# Patient Record
Sex: Female | Born: 1990 | Race: Black or African American | Hispanic: No | Marital: Single | Smoking: Never smoker
Health system: Southern US, Community
[De-identification: ages and names within clinical notes are randomized; demographics above are authoritative.]

## PROBLEM LIST (undated history)

## (undated) ENCOUNTER — Inpatient Hospital Stay (HOSPITAL_COMMUNITY): Payer: Self-pay

## (undated) DIAGNOSIS — D649 Anemia, unspecified: Secondary | ICD-10-CM

## (undated) DIAGNOSIS — Z148 Genetic carrier of other disease: Secondary | ICD-10-CM

## (undated) DIAGNOSIS — O039 Complete or unspecified spontaneous abortion without complication: Secondary | ICD-10-CM

## (undated) HISTORY — PX: NO PAST SURGERIES: SHX2092

## (undated) HISTORY — DX: Genetic carrier of other disease: Z14.8

---

## 2012-10-16 ENCOUNTER — Encounter (HOSPITAL_COMMUNITY): Payer: Self-pay | Admitting: Cardiology

## 2012-10-16 ENCOUNTER — Emergency Department (HOSPITAL_COMMUNITY)
Admission: EM | Admit: 2012-10-16 | Discharge: 2012-10-16 | Disposition: A | Discharge status: Home / Self Care | Payer: Medicaid Other | Attending: Emergency Medicine | Admitting: Emergency Medicine

## 2012-10-16 DIAGNOSIS — L0889 Other specified local infections of the skin and subcutaneous tissue: Secondary | ICD-10-CM | POA: Insufficient documentation

## 2012-10-16 DIAGNOSIS — K140 Glossitis: Secondary | ICD-10-CM

## 2012-10-16 DIAGNOSIS — K122 Cellulitis and abscess of mouth: Secondary | ICD-10-CM

## 2012-10-16 MED ORDER — SULFAMETHOXAZOLE-TMP DS 800-160 MG PO TABS
1.0000 | ORAL_TABLET | Freq: Once | ORAL | Status: AC
  Administered 2012-10-16: 1 via ORAL
  Filled 2012-10-16: qty 1

## 2012-10-16 MED ORDER — SULFAMETHOXAZOLE-TRIMETHOPRIM 400-80 MG PO TABS: 1.0000 | ORAL_TABLET | Freq: Two times a day (BID) | ORAL | Status: DC

## 2012-10-16 NOTE — ED Notes (Signed)
Pt reports about 2 weeks ago she noticed a lump under her tongue. Reports some swelling. Pt states she had her tongue pierced about a year ago, but no problems since.

## 2012-10-16 NOTE — ED Provider Notes (Signed)
History  This chart was scribed for non-physician practitioner Marlon Pel, PA-C working with Juliet Rude. Rubin Payor, MD, by Candelaria Stagers, ED Scribe. This patient was seen in room TR08C/TR08C and the patient's care was started at 6:38 PM   CSN: [id number]  Arrival date & time 10/16/12  1654   First MD Initiated Contact with Patient 10/16/12 1724      Chief Complaint  Patient presents with  . Pain     The history is provided by the patient. No language interpreter was used.   Mary Curry is a 22 y.o. female who presents to the Emergency Department complaining of a lump under her tongue that she noticed two weeks ago with gradually increasing pain.  Pt has a tongue ring that was placed two years ago.  She reports the area is swollen.  She denies SOB, difficulty swallowing, fever, chills, nausea, or vomiting.  Nothing seems to make the sx better or worse.    History reviewed. No pertinent past medical history.  History reviewed. No pertinent past surgical history.  History reviewed. No pertinent family history.  History  Substance Use Topics  . Smoking status: Never Smoker   . Smokeless tobacco: Not on file  . Alcohol Use: No    OB History   Grav Para Term Preterm Abortions TAB SAB Ect Mult Living                  Review of Systems  Constitutional: Negative for fever and chills.  HENT: Negative for sore throat, trouble swallowing and voice change.        Tenderness and pain under the tongue.   Respiratory: Negative for shortness of breath.   Gastrointestinal: Negative for nausea and vomiting.  All other systems reviewed and are negative.    Allergies  Review of patient's allergies indicates no known allergies.  Home Medications   Current Outpatient Rx  Name  Route  Sig  Dispense  Refill  . ibuprofen (ADVIL,MOTRIN) 200 MG tablet   Oral   Take 400 mg by mouth daily as needed for pain.         Marland Kitchen sulfamethoxazole-trimethoprim (SEPTRA) 400-80 MG per tablet    Oral   Take 1 tablet by mouth 2 (two) times daily.   14 tablet   0     BP 118/77  Temp(Src) 98.3 F (36.8 C) (Oral)  Resp 18  SpO2 97%  Physical Exam  Nursing note and vitals reviewed. Constitutional: She is oriented to person, place, and time. She appears well-developed and well-nourished. No distress.  HENT:  Head: Normocephalic and atraumatic.  Pain to the left ventral surface of the tongue with no obvious area of redness or abscess.  Pt has tongue ring in place with food wrapped around.    Eyes: EOM are normal.  Neck: Normal range of motion. Neck supple. No tracheal deviation present.  Cardiovascular: Normal rate.   Pulmonary/Chest: Effort normal. No stridor. No respiratory distress.  Musculoskeletal: Normal range of motion.  Neurological: She is alert and oriented to person, place, and time.  Skin: Skin is warm and dry.  Psychiatric: She has a normal mood and affect. Her behavior is normal.    ED Course  Procedures   DIAGNOSTIC STUDIES: Oxygen Saturation is 97% on room air, normal by my interpretation.    COORDINATION OF CARE:  6:52 PM Discussed course of care with pt which includes antibiotics.  Advised pt to remove the tongue ring. Tongue ring removed in ED. Pt  understands and agrees.   Labs Reviewed - No data to display No results found.   1. Pierced tongue infection       MDM  Pt has been advised of the symptoms that warrant their return to the ED. Patient has voiced understanding and has agreed to follow-up with the PCP or specialist.  I personally performed the services described in this documentation, which was scribed in my presence. The recorded information has been reviewed and is accurate.       Dorthula Matas, PA-C 10/16/12 2326

## 2012-10-17 NOTE — ED Provider Notes (Signed)
Medical screening examination/treatment/procedure(s) were performed by non-physician practitioner and as supervising physician I was immediately available for consultation/collaboration.  Mj Willis R. Irl Bodie, MD 10/17/12 0002 

## 2013-07-29 ENCOUNTER — Encounter (HOSPITAL_COMMUNITY): Payer: Self-pay

## 2013-07-29 ENCOUNTER — Inpatient Hospital Stay (HOSPITAL_COMMUNITY)
Admission: AD | Admit: 2013-07-29 | Discharge: 2013-07-29 | Disposition: A | Discharge status: Home / Self Care | Payer: Medicaid Other | Source: Ambulatory Visit | Attending: Obstetrics & Gynecology | Admitting: Obstetrics & Gynecology

## 2013-07-29 DIAGNOSIS — Z3202 Encounter for pregnancy test, result negative: Secondary | ICD-10-CM | POA: Insufficient documentation

## 2013-07-29 LAB — URINALYSIS, ROUTINE W REFLEX MICROSCOPIC
Bilirubin Urine: NEGATIVE
Ketones, ur: NEGATIVE mg/dL
Protein, ur: 30 mg/dL — AB
Urobilinogen, UA: 1 mg/dL (ref 0.0–1.0)

## 2013-07-29 LAB — CBC
MCH: 28.1 pg (ref 26.0–34.0)
MCHC: 36.7 g/dL — ABNORMAL HIGH (ref 30.0–36.0)
Platelets: 249 10*3/uL (ref 150–400)
RBC: 4.67 MIL/uL (ref 3.87–5.11)
RDW: 13.9 % (ref 11.5–15.5)

## 2013-07-29 LAB — URINE MICROSCOPIC-ADD ON

## 2013-07-29 LAB — ABO/RH: ABO/RH(D): AB POS

## 2013-07-29 NOTE — MAU Note (Addendum)
Here for pregnancy test.  Has done 2 home test, one was neg, one was positive.  Was throwing up this morning.  Has been having bleeding, like a period off and on for past wk.

## 2013-07-29 NOTE — MAU Provider Note (Signed)
History     CSN: [id number]  Arrival date and time: 07/29/13 1129   First Provider Initiated Contact with Patient 07/29/13 1259      Chief Complaint  Patient presents with  . Possible Pregnancy  . Nausea   Possible Pregnancy    Casia Corti is a 22 y.o. who presents today for a pregnancy test. She states that she took two at home. One was negative, and one was positive. She has been bleeding like a period for one week.   Past Medical History  Diagnosis Date  . Medical history non-contributory     Past Surgical History  Procedure Laterality Date  . No past surgeries      No family history on file.  History  Substance Use Topics  . Smoking status: Never Smoker   . Smokeless tobacco: Not on file  . Alcohol Use: No    Allergies: No Known Allergies  Prescriptions prior to admission  Medication Sig Dispense Refill  . ibuprofen (ADVIL,MOTRIN) 200 MG tablet Take 400 mg by mouth daily as needed for pain.      Marland Kitchen sulfamethoxazole-trimethoprim (SEPTRA) 400-80 MG per tablet Take 1 tablet by mouth 2 (two) times daily.  14 tablet  0    ROS Physical Exam   Blood pressure 116/63, pulse 73, temperature 98.9 F (37.2 C), temperature source Oral, resp. rate 18, height 5' 4.5" (1.638 m), weight 68.04 kg (150 lb).  Physical Exam  Nursing note and vitals reviewed. Constitutional: She is oriented to person, place, and time. She appears well-developed and well-nourished. No distress.  Cardiovascular: Normal rate.   Respiratory: Effort normal.  GI: Soft. There is no tenderness.  Neurological: She is alert and oriented to person, place, and time.  Skin: Skin is warm and dry.  Psychiatric: She has a normal mood and affect.    MAU Course  Procedures  Results for orders placed during the hospital encounter of 07/29/13 (from the past 24 hour(s))  PREGNANCY, URINE     Status: None   Collection Time    07/29/13 12:05 PM      Result Value Range   Preg Test, Ur NEGATIVE   NEGATIVE  ABO/RH     Status: None   Collection Time    07/29/13 12:05 PM      Result Value Range   ABO/RH(D) AB POS    HCG, QUANTITATIVE, PREGNANCY     Status: None   Collection Time    07/29/13 12:05 PM      Result Value Range   hCG, Beta Chain, Quant, S <1  <5 mIU/mL  URINALYSIS, ROUTINE W REFLEX MICROSCOPIC     Status: Abnormal   Collection Time    07/29/13 12:05 PM      Result Value Range   Color, Urine YELLOW  YELLOW   APPearance CLOUDY (*) CLEAR   Specific Gravity, Urine 1.015  1.005 - 1.030   pH 8.0  5.0 - 8.0   Glucose, UA NEGATIVE  NEGATIVE mg/dL   Hgb urine dipstick LARGE (*) NEGATIVE   Bilirubin Urine NEGATIVE  NEGATIVE   Ketones, ur NEGATIVE  NEGATIVE mg/dL   Protein, ur 30 (*) NEGATIVE mg/dL   Urobilinogen, UA 1.0  0.0 - 1.0 mg/dL   Nitrite NEGATIVE  NEGATIVE   Leukocytes, UA NEGATIVE  NEGATIVE  URINE MICROSCOPIC-ADD ON     Status: Abnormal   Collection Time    07/29/13 12:05 PM      Result Value Range   Squamous Epithelial /  LPF FEW (*) RARE   RBC / HPF 11-20  <3 RBC/hpf   Bacteria, UA FEW (*) RARE   Urine-Other AMORPHOUS URATES/PHOSPHATES    CBC     Status: Abnormal   Collection Time    07/29/13 12:06 PM      Result Value Range   WBC 6.8  4.0 - 10.5 K/uL   RBC 4.67  3.87 - 5.11 MIL/uL   Hemoglobin 13.1  12.0 - 15.0 g/dL   HCT 16.1 (*) 09.6 - 04.5 %   MCV 76.4 (*) 78.0 - 100.0 fL   MCH 28.1  26.0 - 34.0 pg   MCHC 36.7 (*) 30.0 - 36.0 g/dL   RDW 40.9  81.1 - 91.4 %   Platelets 249  150 - 400 K/uL  POCT PREGNANCY, URINE     Status: None   Collection Time    07/29/13 12:20 PM      Result Value Range   Preg Test, Ur NEGATIVE  NEGATIVE     Assessment and Plan   1. Pregnancy test negative    Follow-up Information   Follow up with [facility] [place]. (As needed)    Contact information:   [address] [place] [address] [phone]     Contraception options discussed   Tawnya Crook 07/29/2013, 1:01 PM

## 2013-10-07 ENCOUNTER — Encounter: Payer: Self-pay | Admitting: Certified Nurse Midwife

## 2014-03-07 ENCOUNTER — Emergency Department (HOSPITAL_COMMUNITY): Payer: Medicaid Other

## 2014-03-07 ENCOUNTER — Emergency Department (HOSPITAL_COMMUNITY)
Admission: EM | Admit: 2014-03-07 | Discharge: 2014-03-07 | Disposition: A | Discharge status: Home / Self Care | Payer: Medicaid Other | Attending: Emergency Medicine | Admitting: Emergency Medicine

## 2014-03-07 ENCOUNTER — Encounter (HOSPITAL_COMMUNITY): Payer: Self-pay | Admitting: Emergency Medicine

## 2014-03-07 DIAGNOSIS — Z3202 Encounter for pregnancy test, result negative: Secondary | ICD-10-CM | POA: Insufficient documentation

## 2014-03-07 DIAGNOSIS — Z792 Long term (current) use of antibiotics: Secondary | ICD-10-CM | POA: Insufficient documentation

## 2014-03-07 DIAGNOSIS — R011 Cardiac murmur, unspecified: Secondary | ICD-10-CM | POA: Insufficient documentation

## 2014-03-07 DIAGNOSIS — R079 Chest pain, unspecified: Secondary | ICD-10-CM

## 2014-03-07 DIAGNOSIS — R0602 Shortness of breath: Secondary | ICD-10-CM | POA: Insufficient documentation

## 2014-03-07 LAB — URINALYSIS, ROUTINE W REFLEX MICROSCOPIC
Bilirubin Urine: NEGATIVE
GLUCOSE, UA: NEGATIVE mg/dL
HGB URINE DIPSTICK: NEGATIVE
Ketones, ur: NEGATIVE mg/dL
Nitrite: NEGATIVE
PROTEIN: NEGATIVE mg/dL
Specific Gravity, Urine: 1.016 (ref 1.005–1.030)
Urobilinogen, UA: 1 mg/dL (ref 0.0–1.0)
pH: 8.5 — ABNORMAL HIGH (ref 5.0–8.0)

## 2014-03-07 LAB — CBC
HEMATOCRIT: 34.8 % — AB (ref 36.0–46.0)
HEMOGLOBIN: 12.6 g/dL (ref 12.0–15.0)
MCH: 28 pg (ref 26.0–34.0)
MCHC: 36.2 g/dL — AB (ref 30.0–36.0)
MCV: 77.3 fL — AB (ref 78.0–100.0)
Platelets: 257 10*3/uL (ref 150–400)
RBC: 4.5 MIL/uL (ref 3.87–5.11)
RDW: 13.4 % (ref 11.5–15.5)
WBC: 7.7 10*3/uL (ref 4.0–10.5)

## 2014-03-07 LAB — I-STAT CHEM 8, ED
BUN: 8 mg/dL (ref 6–23)
CALCIUM ION: 1.14 mmol/L (ref 1.12–1.23)
CREATININE: 0.7 mg/dL (ref 0.50–1.10)
Chloride: 108 mEq/L (ref 96–112)
GLUCOSE: 103 mg/dL — AB (ref 70–99)
HCT: 40 % (ref 36.0–46.0)
Hemoglobin: 13.6 g/dL (ref 12.0–15.0)
Potassium: 3.2 mEq/L — ABNORMAL LOW (ref 3.7–5.3)
SODIUM: 142 meq/L (ref 137–147)
TCO2: 19 mmol/L (ref 0–100)

## 2014-03-07 LAB — URINE MICROSCOPIC-ADD ON

## 2014-03-07 LAB — I-STAT TROPONIN, ED: Troponin i, poc: 0 ng/mL (ref 0.00–0.08)

## 2014-03-07 LAB — POC URINE PREG, ED: Preg Test, Ur: NEGATIVE

## 2014-03-07 NOTE — ED Notes (Signed)
PT comfortable with discharge and follow up instructions. No prescriptions. Pt declines wheelchair, escorted to waiting room.

## 2014-03-07 NOTE — Discharge Instructions (Signed)
Call for a follow up appointment with a Family or Primary Care Provider.  Call a cardiologist for further evaluation of your murmur and chest pain. Return if Symptoms worsen.   Take medication as prescribed.    Emergency Department Resource Guide 1) Find a Doctor and Pay Out of Pocket Although you won't have to find out who is covered by your insurance plan, it is a good idea to ask around and get recommendations. You will then need to call the office and see if the doctor you have chosen will accept you as a new patient and what types of options they offer for patients who are self-pay. Some doctors offer discounts or will set up payment plans for their patients who do not have insurance, but you will need to ask so you aren't surprised when you get to your appointment.  2) Contact Your Local Health Department Not all health departments have doctors that can see patients for sick visits, but many do, so it is worth a call to see if yours does. If you don't know where your local health department is, you can check in your phone book. The CDC also has a tool to help you locate your state's health department, and many state websites also have listings of all of their local health departments.  3) Find a Walk-in Clinic If your illness is not likely to be very severe or complicated, you may want to try a walk in clinic. These are popping up all over the country in pharmacies, drugstores, and shopping centers. They're usually staffed by nurse practitioners or physician assistants that have been trained to treat common illnesses and complaints. They're usually fairly quick and inexpensive. However, if you have serious medical issues or chronic medical problems, these are probably not your best option.  No Primary Care Doctor: - Call Health Connect at  [phone] - they can help you locate a primary care doctor that  accepts your insurance, provides certain services, etc. - Physician Referral Service-  [phone]  Chronic Pain Problems: Organization         Address  Phone   Notes  [facility]  [phone] Patients need to be referred by their primary care doctor.   Medication Assistance: Organization         Address  Phone   Notes  [facility] [address] [place], [place] [address] [phone] --Must be a resident of [place] [place] [place] -- Must have NO insurance coverage whatsoever (no Medicaid/ Medicare, etc.) -- The pt. MUST have a primary care doctor that directs their care regularly and follows them in the community   MedAssist  [phone]   [employer]  [phone]    Agencies that provide inexpensive medical care: Organization         Address  Phone   Notes  [facility]  [phone]   [facility]    [phone]   [facility] [address] [place], [place] [address] [phone]   [facility] [address], [place] [phone]   [facility]anned Parenthood    [phone]   [facility]    [phone]   [facility]  [address], [place] Phone:  [phone], Fax:  [phone] Hours of Operation:  9 am - 6 pm, M-F.  Also accepts Medicaid/Medicare and self-pay.  [facility]  [place] for [place], [address], [place] Phone: [phone], Fax: [phone]. Hours of Operation:  8:30 am - 5:30 pm, M-F.  Also accepts Medicaid and self-pay.  [facility] [address] [place] Phone: [phone]   [place], [place], [place] [phone], Ext. [phone] Mondays & Thursdays: 7-9 AM.  First 15 patients are seen on a first come, first serve basis.    [place] Providers:  Organization         Address  Phone   Notes  [facility] [address],  [place] [phone] Also accepts self-pay patients.  [facility] [id number] [place], [place]  [phone]   [place], [address], [place] [phone]   [facility] [address] [place] [phone]   Lucianne Lei [address], [place]   [phone] Only accepts [place] Access [place] patients after they have their name applied to their card.   Self-Pay (no insurance) in [facility]:  Organization         Address  Phone   Notes  Sickle Cell Patients, [facility] [place] [phone]   [facility] [place] [phone]   [facility] [place]  [place], [place], [place] [phone]   [facility]/Dr. Osei-Bonsu  [address], [place] or [address], [place] [phone] Phone number for both [place] and [place] locations is the same.  [facility] [address] [place] [phone]   [facility]  [address], [place] or [address] [place] [address] [phone]   [facility] [address], [place] [phone], phone; [phone], fax Sees patients 1st and 3rd Saturday of every month.  Must not qualify for public or private insurance (i.e. Medicaid, Medicare, [place] Health Choice, Veterans' Benefits)  Household income should be no more than 200% of the poverty level The clinic cannot treat you if you are pregnant or think you are pregnant  Sexually transmitted diseases are not treated at the clinic.    Dental Care: Organization         Address  Phone  Notes  [facility] Department of [facility] [place] [phone] Accepts children up to age 40 who are enrolled in  [place] or [place]; pregnant women with a Medicaid card; and children who have applied for Medicaid or [place] Health Choice, but were declined, whose parents can pay a reduced fee at time of service.  [facility] of [facility]  [address], [place] [phone] Accepts children up to age 85 who are enrolled in [place] or [place]; pregnant women with a Medicaid card; and children who have applied for Medicaid or [place] Health Choice, but were declined, whose parents can pay a reduced fee at time of service.  [place] [facility] Access PROGRAM  [place] [phone] Patients are seen by appointment only. Walk-ins are not accepted. [place] will see patients 62 years of age and older. Monday - Tuesday (8am-5pm) Most Wednesdays (8:30-5pm) $30 per visit,  cash only  [employer] Adult [employer] PROGRAM  [address], [place] [phone] Patients are seen by appointment only. Walk-ins are not accepted. [place] will see patients 68 years of age and older. One Wednesday Evening (Monthly: Volunteer Based).  $30 per visit, cash only  [place]  [phone] for adults; Children under age 32, call Graduate Pediatric Dentistry at [phone]. Children aged 36-14, please call [phone] to request a pediatric application.  Dental services are provided in all areas of dental care including fillings, crowns and bridges, complete and partial dentures, implants, gum treatment, root canals, and extractions. Preventive care is also provided. Treatment is provided to both adults and children. Patients are selected via a lottery and there is often a waiting list.   [facility] [address] [place]  [phone] [email or web address]   [facility] [address] [place], [place] [phone], Ext. [phone] Second and Fourth Thursday of each month, opens at 6:30  AM; Clinic ends at 9 AM.  Patients are seen on a first-come first-served basis, and a limited number are seen during each clinic.   [facility]  [address] Hillard Danker [place], [place] [phone]   Eligibility Requirements You must have lived in [place], [place], or [place] counties for at least the last three months.   You cannot be eligible for state or federal sponsored [employer], including [employer], [place], or [employer].   You generally cannot be eligible for healthcare insurance through your employer.    How to apply: Eligibility screenings are held every Tuesday and Wednesday afternoon from 1:00 pm until 4:00 pm. You do not need an appointment for the interview!  [facility] [address] [place], [place]   [place]  [place] Department  [place]  [phone]    Behavioral Health Resources in the Community: Intensive Outpatient Programs Organization         Address  Phone  Notes  Bejou Flanagan. [address], [place], [place] [phone]   [facility] Outpatient [address] [place], [place]   ADS: Alcohol & Drug Svcs [address] [place], [place]   [place] [address],  [place], [place] or [phone]   Substance Abuse Resources Organization         Address  Phone  Notes  Alcohol and Drug Services  [phone]   [place]  [phone]   [facility] [place]   Chinita Pester  [phone]   Residential & Outpatient Substance Abuse Program  [phone]   Psychological Services Organization         Address  Phone  Notes  [facility] [place]  [place]  [phone]   [place] [address] [place] or  [phone]    Mobile Crisis Teams Organization         Address  Phone  Notes  [facility], Mobile Crisis Care Unit  [phone]   [facility]  [address] [place], [place]   [place] Levels [address], [place] [phone]    Self-Help/Support Groups Organization         Address  Phone  Notes  [facility] of [place] - variety of support groups  Dora Call for more information  Narcotics Anonymous (NA), [facility] [address], [employer] [place]  2 meetings at this location   Special educational needs teacher         Address  Phone  Notes  [facility]idential Treatment [place],    [place]  [phone]   [facility]  [address] [place] [phone], [place], [place]   [place], [place] [phone] Admissions: 8am-3pm M-F  Incentives Substance [place] [address],    [place], [place] [phone]   [facility] [address] [place], [place], [place]   [facility] [address],  [place], [place]   [facility] - Intensive Outpatient [address] Kristeen Mans 22, [place] at [place], [place]   [facility] ([place] Bessie.,  [place], [place] [phone] or [phone]   [facility] Services (RTS) [address] [place], [place] Accepts Medicaid  [facility] [place] [address],  [place] [phone] Substance Abuse/Addiction Treatment   [facility] Organization         Address  Phone  Notes  [facility]  [phone]   Domenic Schwab, PhD [address] [place]lis [place], [place]   [phone] or [phone]   [place], [place] [phone]   [facility] [address],  [place], [place] [phone] Insurance/Medicaid/sponsorship through [facility] and Families [address], [address] [place]                                    [place], [place] [phone] [place] [address], [place] [phone]    Dr. Adele Schilder  [phone]   [facility] Dept. 1) [address], [place] 2) [place] 3)  [place] [address], [place] [phone]  [phone]   [place] [phone] or [phone] (After Hours)

## 2014-03-07 NOTE — ED Notes (Signed)
Pt presents with a co-worker for chest pain and SOB. Pt breathing at approx 30 breaths a minute, friend states on the ride over her breathing was better and more relaxed but upon arriving here her breathing became "faster" again. Pt reported to friend a sudden onset of central chest pain and SOB starting approx 1400 today. Friend reports pt had a similar episode before and laid down and everything was ok. Pt unable to give report due to breathing, pt's sats are 98% on RA, pt placed on 2 L via [place] for comfort.

## 2014-03-07 NOTE — ED Provider Notes (Signed)
CSN: [id number]     Arrival date & time 03/07/14  1522 History   First MD Initiated Contact with Patient 03/07/14 1632     Chief Complaint  Patient presents with  . Chest Pain  . Shortness of Breath     (Consider location/radiation/quality/duration/timing/severity/associated sxs/prior Treatment) HPI Comments: The patient is a 23 year old female presents emergency room chief complaint of chest pain since last night. The patient reports post an abrupt onset as well as a gradual onset of central chest discomfort and shortness of breath. The patient denies current chest pain or shortness of breath in the ED.  Denies pleurisy, exertional chest pain. Patient reports no aggravating or relieving factors. Denies cough, wheezing, leg swelling. LNMP June, unknown date, history of irregular periods. No recent travel, family history or personal history of DVT/PE, lower extremity swelling, smoking, cancer, or exogenous estrogen.  Denies history of murmur, cardiac disease, IV drug use. NO PCP  Patient is a 23 y.o. female presenting with chest pain and shortness of breath. The history is provided by the patient. No language interpreter was used.  Chest Pain Associated symptoms: shortness of breath   Associated symptoms: no cough, no fever and no palpitations   Shortness of Breath Associated symptoms: chest pain   Associated symptoms: no cough, no fever and no wheezing     Past Medical History  Diagnosis Date  . Medical history non-contributory    Past Surgical History  Procedure Laterality Date  . No past surgeries     History reviewed. No pertinent family history. History  Substance Use Topics  . Smoking status: Never Smoker   . Smokeless tobacco: Not on file  . Alcohol Use: No   OB History   Grav Para Term Preterm Abortions TAB SAB Ect Mult Living                 Review of Systems  Constitutional: Negative for fever and chills.  Respiratory: Positive for shortness of breath. Negative  for cough and wheezing.   Cardiovascular: Positive for chest pain. Negative for palpitations and leg swelling.      Allergies  Review of patient's allergies indicates no known allergies.  Home Medications   Prior to Admission medications   Medication Sig Start Date End Date Taking? Authorizing Provider  ibuprofen (ADVIL,MOTRIN) 200 MG tablet Take 400 mg by mouth daily as needed for pain.    Historical Provider, MD  sulfamethoxazole-trimethoprim (SEPTRA) 400-80 MG per tablet Take 1 tablet by mouth 2 (two) times daily. 10/16/12   Tiffany Irine Seal, PA-C   BP 105/52  Pulse 102  Temp(Src) 98.7 F (37.1 C) (Oral)  Resp 30  SpO2 98% Physical Exam  Nursing note and vitals reviewed. Constitutional: She is oriented to person, place, and time. She appears well-developed and well-nourished.  Non-toxic appearance. She does not have a sickly appearance. She does not appear ill. No distress.  HENT:  Head: Normocephalic and atraumatic.  Eyes: EOM are normal.  Neck: Neck supple.  Cardiovascular: Normal rate and regular rhythm.   Murmur heard. Not tachycardic on exam. No lower extremity edema.  Pulmonary/Chest: Effort normal. No respiratory distress. She has no wheezes. She has no rales.  Patient is able to speak in complete sentences.    Abdominal: Soft. There is no tenderness. There is no rebound.  Musculoskeletal: Normal range of motion.  Neurological: She is alert and oriented to person, place, and time.  Skin: Skin is warm and dry. She is not diaphoretic.  Psychiatric:  Her behavior is normal.    ED Course  Procedures (including critical care time) Labs Review Results for orders placed during the hospital encounter of 03/07/14  CBC      Result Value Ref Range   WBC 7.7  4.0 - 10.5 K/uL   RBC 4.50  3.87 - 5.11 MIL/uL   Hemoglobin 12.6  12.0 - 15.0 g/dL   HCT 16.134.8 (*) 09.636.0 - 04.546.0 %   MCV 77.3 (*) 78.0 - 100.0 fL   MCH 28.0  26.0 - 34.0 pg   MCHC 36.2 (*) 30.0 - 36.0 g/dL   RDW  40.913.4  81.111.5 - 91.415.5 %   Platelets 257  150 - 400 K/uL  URINALYSIS, ROUTINE W REFLEX MICROSCOPIC      Result Value Ref Range   Color, Urine YELLOW  YELLOW   APPearance HAZY (*) CLEAR   Specific Gravity, Urine 1.016  1.005 - 1.030   pH 8.5 (*) 5.0 - 8.0   Glucose, UA NEGATIVE  NEGATIVE mg/dL   Hgb urine dipstick NEGATIVE  NEGATIVE   Bilirubin Urine NEGATIVE  NEGATIVE   Ketones, ur NEGATIVE  NEGATIVE mg/dL   Protein, ur NEGATIVE  NEGATIVE mg/dL   Urobilinogen, UA 1.0  0.0 - 1.0 mg/dL   Nitrite NEGATIVE  NEGATIVE   Leukocytes, UA SMALL (*) NEGATIVE  URINE MICROSCOPIC-ADD ON      Result Value Ref Range   Squamous Epithelial / LPF MANY (*) RARE   WBC, UA 3-6  <3 WBC/hpf   Bacteria, UA FEW (*) RARE  I-STAT TROPOININ, ED      Result Value Ref Range   Troponin i, poc 0.00  0.00 - 0.08 ng/mL   Comment 3           POC URINE PREG, ED      Result Value Ref Range   Preg Test, Ur NEGATIVE  NEGATIVE  I-STAT CHEM 8, ED      Result Value Ref Range   Sodium 142  137 - 147 mEq/L   Potassium 3.2 (*) 3.7 - 5.3 mEq/L   Chloride 108  96 - 112 mEq/L   BUN 8  6 - 23 mg/dL   Creatinine, Ser 7.820.70  0.50 - 1.10 mg/dL   Glucose, Bld 956103 (*) 70 - 99 mg/dL   Calcium, Ion 2.131.14  0.861.12 - 1.23 mmol/L   TCO2 19  0 - 100 mmol/L   Hemoglobin 13.6  12.0 - 15.0 g/dL   HCT 57.840.0  46.936.0 - 62.946.0 %   Dg Chest 2 View  03/07/2014   CLINICAL DATA:  Chest pain and shortness of breath since last night. Nausea, vomiting. No cough or congestion. No fever.  EXAM: CHEST  2 VIEW  COMPARISON:  None.  FINDINGS: The heart size and mediastinal contours are within normal limits. Both lungs are clear. The visualized skeletal structures are unremarkable. No free intraperitoneal air seen beneath the diaphragm.  IMPRESSION: No active cardiopulmonary disease.   Electronically Signed   By: Rosalie GumsBeth  Brown M.D.   On: 03/07/2014 18:02   Imaging Review Dg Chest 2 View  03/07/2014   CLINICAL DATA:  Chest pain and shortness of breath since last night.  Nausea, vomiting. No cough or congestion. No fever.  EXAM: CHEST  2 VIEW  COMPARISON:  None.  FINDINGS: The heart size and mediastinal contours are within normal limits. Both lungs are clear. The visualized skeletal structures are unremarkable. No free intraperitoneal air seen beneath the diaphragm.  IMPRESSION: No active  cardiopulmonary disease.   Electronically Signed   By: Rosalie Gums M.D.   On: 03/07/2014 18:02     EKG Interpretation   Date/Time:  Friday March 07 2014 15:34:17 EDT Ventricular Rate:  104 PR Interval:  156 QRS Duration: 66 QT Interval:  356 QTC Calculation: 468 R Axis:   82 Text Interpretation:  Sinus tachycardia Septal infarct , age undetermined  Abnormal ECG No old tracing to compare Confirmed by CAMPOS  MD, Caryn Bee  ([id number]) on 03/07/2014 6:21:45 PM      MDM   Final diagnoses:  Chest pain, unspecified chest pain type  Murmur   Patient presents with shortness of breath and chest discomfort, initial respiratory 30, on exam patient has normal respiratory can speak in complete sentences, in no respiratory distress, on exam. She is asymptomatic in ED. Patient denies history of murmur or rub murmur heard on auscultation to the chest. Labs, x-ray, EKG ordered.  EKG shows sinus tach. Doubt PE, no known risk factors, no pleurisy, no cough, SpO2 98% or higher on room air, no unilateral swelling, no BC/estrogen, nonsmoker, no history of cancer, personal history or family history of PE. Denies recent travel. Chest x-ray without acute abnormalities, negative pregnancy, mild hypokalemia on i-STAT chem 8. Reevaluation patient resting comfortably in room, texting on cell phone. Discussed negative workup with the patient and need to followup with PCP for further evaluation of your murmur. Discussed lab results, imaging results, and treatment plan with the patient. Return precautions given. Reports understanding and no other concerns at this time.  Patient is stable for discharge at this  time.  Mellody Drown, PA-C 03/09/14 0001

## 2014-03-09 NOTE — ED Provider Notes (Signed)
Medical screening examination/treatment/procedure(s) were performed by non-physician practitioner and as supervising physician I was immediately available for consultation/collaboration.   EKG Interpretation   Date/Time:  Friday March 07 2014 15:34:17 EDT Ventricular Rate:  104 PR Interval:  156 QRS Duration: 66 QT Interval:  356 QTC Calculation: 468 R Axis:   82 Text Interpretation:  Sinus tachycardia Septal infarct , age undetermined  Abnormal ECG No old tracing to compare Confirmed by Malakai Schoenherr  MD, Solina Heron  ([id number]) on 03/07/2014 6:21:45 PM        Lyanne CoKevin M Dene Landsberg, MD 03/09/14 [phone]

## 2014-05-09 ENCOUNTER — Emergency Department (HOSPITAL_COMMUNITY)
Admission: EM | Admit: 2014-05-09 | Discharge: 2014-05-09 | Disposition: A | Discharge status: Home / Self Care | Payer: Medicaid Other | Attending: Emergency Medicine | Admitting: Emergency Medicine

## 2014-05-09 ENCOUNTER — Encounter (HOSPITAL_COMMUNITY): Payer: Self-pay | Admitting: Emergency Medicine

## 2014-05-09 DIAGNOSIS — J029 Acute pharyngitis, unspecified: Secondary | ICD-10-CM | POA: Insufficient documentation

## 2014-05-09 LAB — RAPID STREP SCREEN (MED CTR MEBANE ONLY): STREPTOCOCCUS, GROUP A SCREEN (DIRECT): NEGATIVE

## 2014-05-09 MED ORDER — DEXAMETHASONE SODIUM PHOSPHATE 10 MG/ML IJ SOLN
10.0000 mg | Freq: Once | INTRAMUSCULAR | Status: AC
  Administered 2014-05-09: 10 mg via INTRAMUSCULAR
  Filled 2014-05-09: qty 1

## 2014-05-09 NOTE — Discharge Instructions (Signed)

## 2014-05-09 NOTE — ED Notes (Addendum)
Pt spitting into trash can.  Unable to maintain secretions.  C/o sore throat and headache x 3 days.  Hot potato voice.  Upon assessment of oropharynx, pt's tonsils appear slightly swollen but are not close impeding airway.

## 2014-05-09 NOTE — ED Provider Notes (Signed)
CSN: [id number]     Arrival date & time 05/09/14  1730 History  This chart was scribed for non-physician practitioner, Teressa LowerVrinda Demisha Nokes, NP working with Raeford RazorStephen Kohut, MD by Gwenyth Oberatherine Macek, ED scribe. This patient was seen in room WTR9/WTR9 and the patient's care was started at 6:07 PM.  Chief Complaint  Patient presents with  . Sore Throat  . Headache   Patient is a 23 y.o. female presenting with headaches. The history is provided by the patient. No language interpreter was used.  Headache Associated symptoms: fever (subjective) and sore throat    HPI Comments: Mary Curry is a 23 y.o. female who presents to the Emergency Department complaining of constant, unchanged sore throat and headache that started 3 days ago. She states fever and chills as associated symptoms. Pt tried chloroseptic spray and an unknown medication with no relief to symptoms.  Past Medical History  Diagnosis Date  . Medical history non-contributory    Past Surgical History  Procedure Laterality Date  . No past surgeries     No family history on file. History  Substance Use Topics  . Smoking status: Never Smoker   . Smokeless tobacco: Not on file  . Alcohol Use: No   OB History   Grav Para Term Preterm Abortions TAB SAB Ect Mult Living                 Review of Systems  Constitutional: Positive for fever (subjective) and chills.  HENT: Positive for sore throat.   Neurological: Positive for headaches.  All other systems reviewed and are negative.   Allergies  Review of patient's allergies indicates no known allergies.  Home Medications   Prior to Admission medications   Not on File   BP 105/75  Pulse 106  Temp(Src) 98.5 F (36.9 C) (Oral)  Resp 16  SpO2 100% Physical Exam  Nursing note and vitals reviewed. Constitutional: She is oriented to person, place, and time. She appears well-developed and well-nourished. No distress.  HENT:  Head: Normocephalic and atraumatic.  Mouth/Throat:  Oropharynx is clear and moist. No oropharyngeal exudate.  Redness and swelling of the throat  Eyes: Pupils are equal, round, and reactive to light.  Neck: Neck supple.  Cardiovascular: Normal rate.   Pulmonary/Chest: Effort normal.  Musculoskeletal: She exhibits no edema.  Neurological: She is alert and oriented to person, place, and time. No cranial nerve deficit.  Skin: Skin is warm and dry. No rash noted.  Psychiatric: She has a normal mood and affect. Her behavior is normal.    ED Course  Procedures (including critical care time) DIAGNOSTIC STUDIES: Oxygen Saturation is 100% on RA, normal by my interpretation.    COORDINATION OF CARE: 6:11 PM Will order strep test. Discussed treatment plan with pt at bedside and pt agreed to plan.    Labs Review Labs Reviewed  RAPID STREP SCREEN  CULTURE, GROUP A STREP    Imaging Review No results found.   EKG Interpretation None      MDM   Final diagnoses:  Pharyngitis    No strep. No airway inflammation, treated with steroids   I personally performed the services described in this documentation, which was scribed in my presence. The recorded information has been reviewed and is accurate.    Teressa LowerVrinda Shray Hunley, NP 05/09/14 2016

## 2014-05-10 NOTE — ED Provider Notes (Signed)
Medical screening examination/treatment/procedure(s) were performed by non-physician practitioner and as supervising physician I was immediately available for consultation/collaboration.   EKG Interpretation None       Joycelyn Liska, MD 05/10/14 1955 

## 2014-05-11 LAB — CULTURE, GROUP A STREP

## 2015-10-29 ENCOUNTER — Encounter (HOSPITAL_COMMUNITY): Payer: Self-pay | Admitting: Emergency Medicine

## 2015-10-29 ENCOUNTER — Emergency Department (HOSPITAL_COMMUNITY): Payer: Self-pay

## 2015-10-29 DIAGNOSIS — Z791 Long term (current) use of non-steroidal anti-inflammatories (NSAID): Secondary | ICD-10-CM | POA: Insufficient documentation

## 2015-10-29 DIAGNOSIS — R0789 Other chest pain: Secondary | ICD-10-CM | POA: Insufficient documentation

## 2015-10-29 DIAGNOSIS — Z3202 Encounter for pregnancy test, result negative: Secondary | ICD-10-CM | POA: Insufficient documentation

## 2015-10-29 LAB — CBC
HEMATOCRIT: 34.1 % — AB (ref 36.0–46.0)
HEMOGLOBIN: 12.4 g/dL (ref 12.0–15.0)
MCH: 28.1 pg (ref 26.0–34.0)
MCHC: 36.4 g/dL — ABNORMAL HIGH (ref 30.0–36.0)
MCV: 77.3 fL — AB (ref 78.0–100.0)
Platelets: 248 10*3/uL (ref 150–400)
RBC: 4.41 MIL/uL (ref 3.87–5.11)
RDW: 13.9 % (ref 11.5–15.5)
WBC: 8.4 10*3/uL (ref 4.0–10.5)

## 2015-10-29 LAB — BASIC METABOLIC PANEL
ANION GAP: 7 (ref 5–15)
BUN: 12 mg/dL (ref 6–20)
CHLORIDE: 102 mmol/L (ref 101–111)
CO2: 27 mmol/L (ref 22–32)
Calcium: 9.6 mg/dL (ref 8.9–10.3)
Creatinine, Ser: 0.86 mg/dL (ref 0.44–1.00)
GFR calc Af Amer: >60 mL/min (ref >60)
GLUCOSE: 85 mg/dL (ref 65–99)
POTASSIUM: 3.7 mmol/L (ref 3.5–5.1)
SODIUM: 136 mmol/L (ref 135–145)

## 2015-10-29 LAB — I-STAT TROPONIN, ED: Troponin i, poc: 0 ng/mL (ref 0.00–0.08)

## 2015-10-29 NOTE — ED Notes (Signed)
Pt. reports central chest pain onset 2 weeks ago , denies SOB , mild nausea /no diaphoresis .

## 2015-10-30 ENCOUNTER — Emergency Department (HOSPITAL_COMMUNITY)
Admission: EM | Admit: 2015-10-30 | Discharge: 2015-10-30 | Disposition: A | Discharge status: Home / Self Care | Payer: Self-pay | Attending: Emergency Medicine | Admitting: Emergency Medicine

## 2015-10-30 DIAGNOSIS — R0789 Other chest pain: Secondary | ICD-10-CM

## 2015-10-30 LAB — POC URINE PREG, ED: PREG TEST UR: NEGATIVE

## 2015-10-30 MED ORDER — NAPROXEN 500 MG PO TABS: 500.0000 mg | ORAL_TABLET | Freq: Two times a day (BID) | ORAL | Status: DC

## 2015-10-30 NOTE — Discharge Instructions (Signed)

## 2015-10-30 NOTE — ED Provider Notes (Signed)
CSN: [id number]     Arrival date & time 10/29/15  2233 History  By signing my name below, I, Bethel BornBritney McCollum, attest that this documentation has been prepared under the direction and in the presence of Gilda Creasehristopher J Chanetta Moosman, MD. Electronically Signed: Bethel BornBritney McCollum, ED Scribe. 10/30/2015. 12:31 AM   Chief Complaint  Patient presents with  . Chest Pain    The history is provided by the patient. No language interpreter was used.  Mary PitchFanta Krass is a 25 y.o. female with no significant PMHx  who presents to the Emergency Department complaining of intermittent, sharp, 8/10 in severity, central chest pain with onset 2 weeks ago. The pain lasts for a few seconds and radiates to under the left breast. She has had similar episodes of pain over the last year. Pt denies SOB.  Past Medical History  Diagnosis Date  . Medical history non-contributory    Past Surgical History  Procedure Laterality Date  . No past surgeries     No family history on file. Social History  Substance Use Topics  . Smoking status: Never Smoker   . Smokeless tobacco: None  . Alcohol Use: No   OB History    No data available     Review of Systems  Respiratory: Negative for shortness of breath.   Cardiovascular: Positive for chest pain.  All other systems reviewed and are negative.   Allergies  Review of patient's allergies indicates no known allergies.  Home Medications   Prior to Admission medications   Medication Sig Start Date End Date Taking? Authorizing Provider  Acetaminophen-DM (COUGH & SORE THROAT DAY PO) Take 2 capsules by mouth 3 (three) times daily as needed (sore throat).     Historical Provider, MD  ibuprofen (ADVIL,MOTRIN) 200 MG tablet Take 800 mg by mouth every 6 (six) hours as needed for headache (headache).    Historical Provider, MD  naproxen (NAPROSYN) 500 MG tablet Take 1 tablet (500 mg total) by mouth 2 (two) times daily. 10/30/15   Gilda Creasehristopher J Marguarite Markov, MD  Phenol ([employer] SORE THROAT SPRAY  MT) Use as directed 2 sprays in the mouth or throat every 4 (four) hours as needed (sore throat).    Historical Provider, MD   BP 131/89 mmHg  Pulse 79  Temp(Src) 99.1 F (37.3 C)  Resp 16  Ht 5\' 5"  (1.651 m)  Wt 158 lb (71.668 kg)  BMI 26.29 kg/m2  SpO2 100%  LMP 09/28/2015 (Approximate) Physical Exam  Constitutional: She is oriented to person, place, and time. She appears well-developed and well-nourished. No distress.  HENT:  Head: Normocephalic and atraumatic.  Right Ear: Hearing normal.  Left Ear: Hearing normal.  Nose: Nose normal.  Mouth/Throat: Oropharynx is clear and moist and mucous membranes are normal.  Eyes: Conjunctivae and EOM are normal. Pupils are equal, round, and reactive to light.  Neck: Normal range of motion. Neck supple.  Cardiovascular: Regular rhythm, S1 normal and S2 normal.  Exam reveals no gallop and no friction rub.   No murmur heard. Pulmonary/Chest: Effort normal and breath sounds normal. No respiratory distress. She exhibits tenderness (left sided chest wall tenderness).  Abdominal: Soft. Normal appearance and bowel sounds are normal. There is no hepatosplenomegaly. There is no tenderness. There is no rebound, no guarding, no tenderness at McBurney's point and negative Murphy's sign. No hernia.  Musculoskeletal: Normal range of motion.  Neurological: She is alert and oriented to person, place, and time. She has normal strength. No cranial nerve deficit or sensory  deficit. Coordination normal. GCS eye subscore is 4. GCS verbal subscore is 5. GCS motor subscore is 6.  Skin: Skin is warm, dry and intact. No rash noted. No cyanosis.  Psychiatric: She has a normal mood and affect. Her speech is normal and behavior is normal. Thought content normal.  Nursing note and vitals reviewed.   ED Course  Procedures (including critical care time) DIAGNOSTIC STUDIES: Oxygen Saturation is 100% on RA,  normal by my interpretation.    COORDINATION OF CARE: 12:29 AM  Discussed treatment plan which includes lab work, CXR, EKG with pt at bedside and pt agreed to plan.  Labs Review Labs Reviewed  CBC - Abnormal; Notable for the following:    HCT 34.1 (*)    MCV 77.3 (*)    MCHC 36.4 (*)    All other components within normal limits  BASIC METABOLIC PANEL  I-STAT TROPOININ, ED  POC URINE PREG, ED    Imaging Review Dg Chest 2 View  10/29/2015  CLINICAL DATA:  Chest pain EXAM: CHEST  2 VIEW COMPARISON:  03/07/2014 chest radiograph. FINDINGS: Stable cardiomediastinal silhouette with normal heart size. No pneumothorax. No pleural effusion. Lungs appear clear, with no acute consolidative airspace disease and no pulmonary edema. IMPRESSION: No active cardiopulmonary disease. Electronically Signed   By: Delbert Phenix M.D.   On: 10/29/2015 23:31   I have personally reviewed and evaluated these images and lab results as part of my medical decision-making.   EKG Interpretation None       Date: 10/30/2015  Rate: 85  Rhythm: normal sinus rhythm  QRS Axis: normal  Intervals: normal  ST/T Wave abnormalities: normal  Conduction Disutrbances: none  Narrative Interpretation: unremarkable      MDM   Final diagnoses:  Chest wall pain   She presents to the ER for evaluation of chest pain. Patient has been expressing intermittent sharp stabbing pains in the left chest for the last 2 weeks she has had similar pains in the past, unsure of the diagnosis. Patient's cardiac workup is negative. She has no cardiac risk factors. Likewise PERC negative, Wells 0. Examination reveals significant chest wall tenderness, patient will be treated for chest wall pain.  I personally performed the services described in this documentation, which was scribed in my presence. The recorded information has been reviewed and is accurate.    Gilda Crease, MD 10/30/15 0130

## 2016-05-06 ENCOUNTER — Emergency Department (HOSPITAL_COMMUNITY)
Admission: EM | Admit: 2016-05-06 | Discharge: 2016-05-06 | Disposition: A | Discharge status: Home / Self Care | Payer: No Typology Code available for payment source | Attending: Emergency Medicine | Admitting: Emergency Medicine

## 2016-05-06 ENCOUNTER — Encounter (HOSPITAL_COMMUNITY): Payer: Self-pay | Admitting: *Deleted

## 2016-05-06 DIAGNOSIS — R109 Unspecified abdominal pain: Secondary | ICD-10-CM | POA: Insufficient documentation

## 2016-05-06 DIAGNOSIS — Z5321 Procedure and treatment not carried out due to patient leaving prior to being seen by health care provider: Secondary | ICD-10-CM | POA: Diagnosis not present

## 2016-05-06 LAB — CBC
HCT: 35.9 % — ABNORMAL LOW (ref 36.0–46.0)
HEMOGLOBIN: 12.6 g/dL (ref 12.0–15.0)
MCH: 27.3 pg (ref 26.0–34.0)
MCHC: 35.1 g/dL (ref 30.0–36.0)
MCV: 77.7 fL — ABNORMAL LOW (ref 78.0–100.0)
Platelets: 254 10*3/uL (ref 150–400)
RBC: 4.62 MIL/uL (ref 3.87–5.11)
RDW: 14.3 % (ref 11.5–15.5)
WBC: 7.2 10*3/uL (ref 4.0–10.5)

## 2016-05-06 LAB — COMPREHENSIVE METABOLIC PANEL
ALK PHOS: 58 U/L (ref 38–126)
ALT: 15 U/L (ref 14–54)
ANION GAP: 7 (ref 5–15)
AST: 19 U/L (ref 15–41)
Albumin: 4.1 g/dL (ref 3.5–5.0)
BILIRUBIN TOTAL: 0.4 mg/dL (ref 0.3–1.2)
BUN: 8 mg/dL (ref 6–20)
CALCIUM: 9.5 mg/dL (ref 8.9–10.3)
CO2: 24 mmol/L (ref 22–32)
Chloride: 106 mmol/L (ref 101–111)
Creatinine, Ser: 0.73 mg/dL (ref 0.44–1.00)
GLUCOSE: 98 mg/dL (ref 65–99)
POTASSIUM: 3.6 mmol/L (ref 3.5–5.1)
Sodium: 137 mmol/L (ref 135–145)
TOTAL PROTEIN: 7.2 g/dL (ref 6.5–8.1)

## 2016-05-06 LAB — LIPASE, BLOOD: Lipase: 36 U/L (ref 11–51)

## 2016-05-06 LAB — I-STAT BETA HCG BLOOD, ED (MC, WL, AP ONLY)

## 2016-05-06 NOTE — ED Notes (Signed)
Pt's boyfriend came to desk and stated patient ready to leave.  This Rn spoke directly to patient.  Patient encouraged to stay.  Return precautions discussed.

## 2016-05-06 NOTE — ED Triage Notes (Signed)
Pt reports abd pain and n/v since yesterday, mild diarrhea.

## 2017-04-24 ENCOUNTER — Ambulatory Visit (INDEPENDENT_AMBULATORY_CARE_PROVIDER_SITE_OTHER): Payer: BLUE CROSS/BLUE SHIELD | Admitting: General Practice

## 2017-04-24 ENCOUNTER — Encounter: Payer: Self-pay | Admitting: General Practice

## 2017-04-24 DIAGNOSIS — Z3201 Encounter for pregnancy test, result positive: Secondary | ICD-10-CM

## 2017-04-24 LAB — POCT PREGNANCY, URINE: PREG TEST UR: POSITIVE — AB

## 2017-04-24 NOTE — Progress Notes (Signed)
Patient here for upt today. UPT +. Patient reports first positive home test earlier today. LMP 03/20/17 EDD 12/25/17 5 weeks today. Patient denies taking any medication or vitamins. Encouraged patient to start PNV & ob care.  Pregnancy verification letter given. Patient had no questions

## 2017-04-24 NOTE — Progress Notes (Signed)
Reviewed lab and nurses note, agree with plan.

## 2017-05-20 ENCOUNTER — Emergency Department: Payer: BLUE CROSS/BLUE SHIELD

## 2017-05-20 ENCOUNTER — Encounter: Payer: Self-pay | Admitting: Emergency Medicine

## 2017-05-20 ENCOUNTER — Emergency Department
Admission: EM | Admit: 2017-05-20 | Discharge: 2017-05-20 | Disposition: A | Discharge status: Home / Self Care | Payer: BLUE CROSS/BLUE SHIELD | Attending: Emergency Medicine | Admitting: Emergency Medicine

## 2017-05-20 DIAGNOSIS — O200 Threatened abortion: Secondary | ICD-10-CM

## 2017-05-20 DIAGNOSIS — R1030 Lower abdominal pain, unspecified: Secondary | ICD-10-CM | POA: Diagnosis not present

## 2017-05-20 DIAGNOSIS — O209 Hemorrhage in early pregnancy, unspecified: Secondary | ICD-10-CM | POA: Diagnosis present

## 2017-05-20 DIAGNOSIS — N939 Abnormal uterine and vaginal bleeding, unspecified: Secondary | ICD-10-CM

## 2017-05-20 DIAGNOSIS — N760 Acute vaginitis: Secondary | ICD-10-CM | POA: Insufficient documentation

## 2017-05-20 DIAGNOSIS — Z3A01 Less than 8 weeks gestation of pregnancy: Secondary | ICD-10-CM | POA: Diagnosis not present

## 2017-05-20 DIAGNOSIS — B9689 Other specified bacterial agents as the cause of diseases classified elsewhere: Secondary | ICD-10-CM

## 2017-05-20 LAB — URINALYSIS, COMPLETE (UACMP) WITH MICROSCOPIC
Bilirubin Urine: NEGATIVE
GLUCOSE, UA: NEGATIVE mg/dL
HGB URINE DIPSTICK: NEGATIVE
KETONES UR: NEGATIVE mg/dL
Leukocytes, UA: NEGATIVE
NITRITE: NEGATIVE
PROTEIN: 30 mg/dL — AB
RBC / HPF: NONE SEEN RBC/hpf (ref 0–5)
Specific Gravity, Urine: 1.023 (ref 1.005–1.030)
pH: 7 (ref 5.0–8.0)

## 2017-05-20 LAB — BASIC METABOLIC PANEL
Anion gap: 9 (ref 5–15)
BUN: 8 mg/dL (ref 6–20)
CO2: 24 mmol/L (ref 22–32)
Calcium: 9.1 mg/dL (ref 8.9–10.3)
Chloride: 102 mmol/L (ref 101–111)
Creatinine, Ser: 0.55 mg/dL (ref 0.44–1.00)
GFR calc Af Amer: >60 mL/min (ref >60)
GLUCOSE: 84 mg/dL (ref 65–99)
Potassium: 3.6 mmol/L (ref 3.5–5.1)
Sodium: 135 mmol/L (ref 135–145)

## 2017-05-20 LAB — ABO/RH: ABO/RH(D): AB POS

## 2017-05-20 LAB — HCG, QUANTITATIVE, PREGNANCY: hCG, Beta Chain, Quant, S: 48947 m[IU]/mL — ABNORMAL HIGH (ref <5)

## 2017-05-20 LAB — WET PREP, GENITAL
SPERM: NONE SEEN
Trich, Wet Prep: NONE SEEN
Yeast Wet Prep HPF POC: NONE SEEN

## 2017-05-20 LAB — CHLAMYDIA/NGC RT PCR (ARMC ONLY)
Chlamydia Tr: NOT DETECTED
N gonorrhoeae: NOT DETECTED

## 2017-05-20 MED ORDER — METRONIDAZOLE 500 MG PO TABS: 500.0000 mg | ORAL_TABLET | Freq: Two times a day (BID) | ORAL | 0 refills | Status: DC

## 2017-05-20 MED ORDER — ACETAMINOPHEN 500 MG PO TABS
1000.0000 mg | ORAL_TABLET | Freq: Once | ORAL | Status: AC
Start: 2017-05-20 — End: 2017-05-20
  Administered 2017-05-20: 1000 mg via ORAL
  Filled 2017-05-20: qty 2

## 2017-05-20 NOTE — ED Provider Notes (Signed)
[facility] Emergency Department Provider Note  ____________________________________________  Time seen: Approximately 1:30 PM  I have reviewed the triage vital signs and the nursing notes.   HISTORY  Chief Complaint Abdominal Pain and Vaginal Bleeding    HPI Mary Curry is a 26 y.o. female G1P0 approx 8-[redacted] weeks pregnant w/o established OB care who has had 2 wks of vaginal spotting with lower abdominal cramping.  The pt reports it started as "creamy with pink" and now she is seeing pink blood on her pantyliner.  Today she had one small blood clot. She has not tried anything for her pain.No sexual activity.  No fever, chills, lighteadedness or syncope.   Past Medical History:  Diagnosis Date  . Medical history non-contributory     There are no active problems to display for this patient.   Past Surgical History:  Procedure Laterality Date  . NO PAST SURGERIES      Current Outpatient Rx  . Order #: [id number] Class: Historical Med  . Order #: [id number] Class: Historical Med  . Order #: [id number] Class: Print  . Order #: [id number] Class: Print  . Order #: [id number] Class: Historical Med    Allergies Patient has no known allergies.  No family history on file.  Social History Social History  Substance Use Topics  . Smoking status: Never Smoker  . Smokeless tobacco: Never Used  . Alcohol use No    Review of Systems Constitutional: No fever/chills. No lightheadedness or syncope Eyes: No visual changes. ENT: No sore throat. No congestion or rhinorrhea. Cardiovascular: Denies chest pain. Denies palpitations. Respiratory: Denies shortness of breath.  No cough. Gastrointestinal: No abdominal pain.  No nausea, no vomiting.  No diarrhea.  No constipation. Genitourinary: Negative for dysuria. + vaginal bleeding  + pelvic cramping. Musculoskeletal: Negative for back pain. Skin: Negative for rash. Neurological: Negative for headaches. No focal numbness,  tingling or weakness.     ____________________________________________   PHYSICAL EXAM:  VITAL SIGNS: ED Triage Vitals  Enc Vitals Group     BP 05/20/17 1059 96/73     Pulse Rate 05/20/17 1059 70     Resp 05/20/17 1059 18     Temp 05/20/17 1059 98.1 F (36.7 C)     Temp Source 05/20/17 1059 Oral     SpO2 05/20/17 1059 100 %     Weight 05/20/17 1101 155 lb (70.3 kg)     Height 05/20/17 1101 5\' 4"  (1.626 m)     Head Circumference --      Peak Flow --      Pain Score 05/20/17 1058 8     Pain Loc --      Pain Edu? --      Excl. in GC? --     Constitutional: Alert and oriented. Well appearing and in no acute distress. Answers questions appropriately. Eyes: Conjunctivae are normal.  EOMI. No scleral icterus. Head: Atraumatic. Nose: No congestion/rhinnorhea. Mouth/Throat: Mucous membranes are moist.  Neck: No stridor.  Supple.   Cardiovascular: Normal rate, regular rhythm. No murmurs, rubs or gallops.  Respiratory: Normal respiratory effort.  No accessory muscle use or retractions. Lungs CTAB.  No wheezes, rales or ronchi. Gastrointestinal: Soft, and nondistended.  Ttp w/o focality in the entirety of the lower abdomen. No masses. No guarding or rebound.  No peritoneal signs. Genitourinary: Normal-appearing external genitalia without lesions. Vaginal exam with white bloodtinged discharge, normal-appearing cervix, normal vaginal wall tissue. Bimanual exam is negative for CMT, adnexal tenderness to palpation, no palpable masses. Cervix  is posterior and cannot assess whether os is opened or closed. Musculoskeletal: No LE edema. No ttp in the calves or palpable cords.  Negative Homan's sign. Neurologic:  A&Ox3.  Speech is clear.  Face and smile are symmetric.  EOMI.  Moves all extremities well. Skin:  Skin is warm, dry and intact. No rash noted. Psychiatric: Mood and affect are normal. Speech and behavior are normal.  Normal judgement.  ____________________________________________    LABS (all labs ordered are listed, but only abnormal results are displayed)  Labs Reviewed  WET PREP, GENITAL - Abnormal; Notable for the following:       Result Value   Clue Cells Wet Prep HPF POC PRESENT (*)    WBC, Wet Prep HPF POC RARE (*)    All other components within normal limits  HCG, QUANTITATIVE, PREGNANCY - Abnormal; Notable for the following:    hCG, Beta Chain, Quant, S 11,914 (*)    All other components within normal limits  URINALYSIS, COMPLETE (UACMP) WITH MICROSCOPIC - Abnormal; Notable for the following:    Color, Urine YELLOW (*)    APPearance CLOUDY (*)    Protein, ur 30 (*)    Bacteria, UA RARE (*)    Squamous Epithelial / LPF 6-30 (*)    All other components within normal limits  CHLAMYDIA/NGC RT PCR ([facility] ONLY)  BASIC METABOLIC PANEL  CBC  ABO/RH   ____________________________________________  EKG  Not indicated. ____________________________________________  RADIOLOGY  US Ob Comp Less 14 Wks  Result Date: 05/20/2017 CLINICAL DATA:  26 year old female reportedly [redacted] weeks pregnant with vaginal bleeding over the past 2 weeks. The quantitative beta HCG is 48,947 EXAM: OBSTETRIC <14 WK [place] AND TRANSVAGINAL OB US TECHNIQUE: Both transabdominal and transvaginal ultrasound examinations were performed for complete evaluation of the gestation as well as the maternal uterus, adnexal regions, and pelvic cul-de-sac. Transvaginal technique was performed to assess early pregnancy. COMPARISON:  None. FINDINGS: Intrauterine gestational sac: Single but within the lower uterine segment. The contour of the gestational sac appears slightly irregular. Yolk sac:  Visualized. Embryo:  Visualized. Cardiac Activity: Not Visualized. CRL:  3  mm   5 w   6 d Subchorionic hemorrhage:  None visualized. Maternal uterus/adnexae: The left ovary could not be visualized. The right ovary is normal in finding. No free fluid. IMPRESSION: 1. A single intrauterine gestational sac is  identified within the lower uterine segment. The contour the gestational sac appears slightly irregular and no cardiac activity is identified within the fetal pole. By crown-rump length, the estimated gestational age is 5 weeks 6 days which is not congruent with the reported gestational age of [redacted] weeks 1 day. Findings are highly suspicious for failed intrauterine pregnancy. Electronically Signed   By: Malachy Moan M.D.   On: 05/20/2017 14:23   US Ob Transvaginal  Result Date: 05/20/2017 CLINICAL DATA:  26 year old female reportedly [redacted] weeks pregnant with vaginal bleeding over the past 2 weeks. The quantitative beta HCG is 48,947 EXAM: OBSTETRIC <14 WK [place] AND TRANSVAGINAL OB US TECHNIQUE: Both transabdominal and transvaginal ultrasound examinations were performed for complete evaluation of the gestation as well as the maternal uterus, adnexal regions, and pelvic cul-de-sac. Transvaginal technique was performed to assess early pregnancy. COMPARISON:  None. FINDINGS: Intrauterine gestational sac: Single but within the lower uterine segment. The contour of the gestational sac appears slightly irregular. Yolk sac:  Visualized. Embryo:  Visualized. Cardiac Activity: Not Visualized. CRL:  3  mm   5 w   6  d Subchorionic hemorrhage:  None visualized. Maternal uterus/adnexae: The left ovary could not be visualized. The right ovary is normal in finding. No free fluid. IMPRESSION: 1. A single intrauterine gestational sac is identified within the lower uterine segment. The contour the gestational sac appears slightly irregular and no cardiac activity is identified within the fetal pole. By crown-rump length, the estimated gestational age is 5 weeks 6 days which is not congruent with the reported gestational age of [redacted] weeks 1 day. Findings are highly suspicious for failed intrauterine pregnancy. Electronically Signed   By: Malachy Moan M.D.   On: 05/20/2017 14:23     ____________________________________________   PROCEDURES  Procedure(s) performed: None  Procedures  Critical Care performed: No ____________________________________________   INITIAL IMPRESSION / ASSESSMENT AND PLAN / ED COURSE  Pertinent labs & imaging results that were available during my care of the patient were reviewed by me and considered in my medical decision making (see chart for details).  26 y.o. G1P0 approximately 8-[redacted] weeks pregnant at [redacted] weeks of vaginal spotting and wrapping. We will plan to send the patient for an ultrasound to evaluate for intrauterine pregnancy and rule ou ectopic pregnancy. Consider intravaginal infection. The patient is hemodynamically stab with reassuring blood counts.  Differential diagnosis includes first trimester bleeding or threatened ab, intravaginal infection, ectopic pregnancy.  ----------------------------------------- 3:19 PM on 05/20/2017 -----------------------------------------  The patient does have some clue cell or prep and will be treated for bacterial vaginosis. Her U/s shows single intrauterine pregnancy w/ irregular border and no cardiac activity.  ____________________________________________  FINAL CLINICAL IMPRESSION(S) / ED DIAGNOSES  Final diagnoses:  Bacterial vaginosis  Vaginal bleeding in pregnancy, first trimester  Threatened abortion in first trimester         NEW MEDICATIONS STARTED DURING THIS VISIT:  New Prescriptions   METRONIDAZOLE (FLAGYL) 500 MG TABLET    Take 1 tablet (500 mg total) by mouth 2 (two) times daily.      Rockne Menghini, MD 05/20/17 1525

## 2017-05-20 NOTE — ED Notes (Signed)

## 2017-05-20 NOTE — ED Triage Notes (Signed)
Patient to ER for c/o vaginal bleeding during pregnancy. LMP was 03/19/17. States bleeding began as light pink, then noticed clot today. Also having cramping.

## 2017-05-20 NOTE — ED Notes (Signed)
Patient transported to Ultrasound 

## 2017-05-20 NOTE — Discharge Instructions (Signed)
Please call the clinic to make an appointment for your continued care on Monday morning.  You may expect continued bleeding and cramping; you may take Tylenol for your symptoms.  Please take the entire course of antibiotics, even if you are feeling better.  Return to the emergency department for severe pain, increased bleeding (soaking more than 2 pads per hour), lightheadedness or fainting, fever, or any other symptoms concerning to you.

## 2017-05-22 ENCOUNTER — Other Ambulatory Visit: Payer: Self-pay | Admitting: Obstetrics and Gynecology

## 2017-05-22 ENCOUNTER — Encounter: Payer: Self-pay | Admitting: Obstetrics and Gynecology

## 2017-05-22 ENCOUNTER — Ambulatory Visit (INDEPENDENT_AMBULATORY_CARE_PROVIDER_SITE_OTHER): Payer: BLUE CROSS/BLUE SHIELD | Admitting: Obstetrics and Gynecology

## 2017-05-22 VITALS — BP 95/60 | HR 71 | Wt 157.3 lb

## 2017-05-22 DIAGNOSIS — O200 Threatened abortion: Secondary | ICD-10-CM

## 2017-05-22 DIAGNOSIS — Z3401 Encounter for supervision of normal first pregnancy, first trimester: Secondary | ICD-10-CM | POA: Diagnosis not present

## 2017-05-22 LAB — POCT URINALYSIS DIPSTICK
Bilirubin, UA: NEGATIVE
Blood, UA: NEGATIVE
Glucose, UA: NEGATIVE
Ketones, UA: NEGATIVE
LEUKOCYTES UA: NEGATIVE
Nitrite, UA: NEGATIVE
PROTEIN UA: NEGATIVE
Spec Grav, UA: 1.02 (ref 1.010–1.025)
UROBILINOGEN UA: 0.2 U/dL
pH, UA: 6 (ref 5.0–8.0)

## 2017-05-22 NOTE — Progress Notes (Signed)
HPI:      [name] is a 26 y.o. G1P0 who LMP was [name] was 03/17/2017.  Subjective:   She presents today after being seen in the emergency department on Saturday for vaginal bleeding and positive pregnancy test.  Her bleeding has since resolved.  Ultrasound performed in the emergency department revealed a sac without fetal pole.  Sac was somewhat irregular and located low in the uterus. The patient is taking prenatal vitamins.    Hx: The following portions of the patient's history were reviewed and updated as appropriate:             She  has a past medical history of Medical history non-contributory. She  does not have a problem list on file. She  has a past surgical history that includes No past surgeries. Her family history is not on file. She  reports that she has never smoked. She has never used smokeless tobacco. She reports that she does not drink alcohol or use drugs. She has No Known Allergies.       Review of Systems:  Review of Systems  Constitutional: Denied constitutional symptoms, night sweats, recent illness, fatigue, fever, insomnia and weight loss.  Eyes: Denied eye symptoms, eye pain, photophobia, vision change and visual disturbance.  Ears/Nose/Throat/Neck: Denied ear, nose, throat or neck symptoms, hearing loss, nasal discharge, sinus congestion and sore throat.  Cardiovascular: Denied cardiovascular symptoms, arrhythmia, chest pain/pressure, edema, exercise intolerance, orthopnea and palpitations.  Respiratory: Denied pulmonary symptoms, asthma, pleuritic pain, productive sputum, cough, dyspnea and wheezing.  Gastrointestinal: Denied, gastro-esophageal reflux, melena, nausea and vomiting.  Genitourinary: Denied genitourinary symptoms including symptomatic vaginal discharge, pelvic relaxation issues, and urinary complaints.  Musculoskeletal: Denied musculoskeletal symptoms, stiffness, swelling, muscle weakness and myalgia.   Dermatologic: Denied dermatology symptoms, rash and scar.  Neurologic: Denied neurology symptoms, dizziness, headache, neck pain and syncope.  Psychiatric: Denied psychiatric symptoms, anxiety and depression.  Endocrine: Denied endocrine symptoms including hot flashes and night sweats.   Meds:   Current Outpatient Prescriptions on File Prior to Visit  Medication Sig Dispense Refill  . Acetaminophen-DM (COUGH & SORE THROAT DAY PO) Take 2 capsules by mouth 3 (three) times daily as needed (sore throat).     Marland Kitchen ibuprofen (ADVIL,MOTRIN) 200 MG tablet Take 800 mg by mouth every 6 (six) hours as needed for headache (headache).    . metroNIDAZOLE (FLAGYL) 500 MG tablet Take 1 tablet (500 mg total) by mouth 2 (two) times daily. (Patient not taking: Reported on 05/22/2017) 14 tablet 0  . naproxen (NAPROSYN) 500 MG tablet Take 1 tablet (500 mg total) by mouth 2 (two) times daily. (Patient not taking: Reported on 05/22/2017) 14 tablet 0  . Phenol ([employer] SORE THROAT SPRAY MT) Use as directed 2 sprays in the mouth or throat every 4 (four) hours as needed (sore throat).     No current facility-administered medications on file prior to visit.     Objective:     Vitals:   05/22/17 1018  BP: 95/60  Pulse: 71                Assessment:    G1P0 There are no active problems to display for this patient.    1. Encounter for supervision of normal first pregnancy in first trimester   2. Threatened abortion     Patient no longer bleeding, but ultrasound from the ED suspicious for missed AB.   Plan:  1.  Ultrasound here tomorrow.  Consider options after visit. Orders Orders Placed This Encounter  Procedures  . POCT urinalysis dipstick    No orders of the defined types were placed in this encounter.     F/U  Return in about 1 day (around 05/23/2017). I spent 22 minutes with this patient of which greater than 50% was spent discussing the possibility of first trimester miscarriage,  signs and symptoms of miscarriage, discussing her emergency room visit for vaginal bleeding in the first trimester.  Elonda Huskyavid J. Evans, M.D. 05/22/2017 10:45 AM

## 2017-05-22 NOTE — Progress Notes (Signed)
ROB- pt states she is still slightly cramping, has not gotten worse since hospital visit, denies any vaginal bleeding, they are concerned that last u/s they could not find heartbeat

## 2017-05-23 ENCOUNTER — Ambulatory Visit (INDEPENDENT_AMBULATORY_CARE_PROVIDER_SITE_OTHER): Payer: BLUE CROSS/BLUE SHIELD

## 2017-05-23 ENCOUNTER — Ambulatory Visit (INDEPENDENT_AMBULATORY_CARE_PROVIDER_SITE_OTHER): Payer: BLUE CROSS/BLUE SHIELD | Admitting: Obstetrics and Gynecology

## 2017-05-23 VITALS — BP 99/62 | HR 70 | Wt 159.0 lb

## 2017-05-23 DIAGNOSIS — O200 Threatened abortion: Secondary | ICD-10-CM

## 2017-05-23 NOTE — Progress Notes (Signed)
HPI:      [name] is a 26 y.o. G1P0 who LMP was [name] was 03/17/2017.  Subjective:   She presents today after having an ultrasound for threatened AB.  She states that she is bleeding lightly without cramps or clots.    Hx: The following portions of the patient's history were reviewed and updated as appropriate:             She  has a past medical history of Medical history non-contributory. She  does not have a problem list on file. She  has a past surgical history that includes No past surgeries. Her family history is not on file. She  reports that she has never smoked. She has never used smokeless tobacco. She reports that she does not drink alcohol or use drugs. She has No Known Allergies.       Review of Systems:  Review of Systems  Constitutional: Denied constitutional symptoms, night sweats, recent illness, fatigue, fever, insomnia and weight loss.  Eyes: Denied eye symptoms, eye pain, photophobia, vision change and visual disturbance.  Ears/Nose/Throat/Neck: Denied ear, nose, throat or neck symptoms, hearing loss, nasal discharge, sinus congestion and sore throat.  Cardiovascular: Denied cardiovascular symptoms, arrhythmia, chest pain/pressure, edema, exercise intolerance, orthopnea and palpitations.  Respiratory: Denied pulmonary symptoms, asthma, pleuritic pain, productive sputum, cough, dyspnea and wheezing.  Gastrointestinal: Denied, gastro-esophageal reflux, melena, nausea and vomiting.  Genitourinary: See HPI for additional information.  Musculoskeletal: Denied musculoskeletal symptoms, stiffness, swelling, muscle weakness and myalgia.  Dermatologic: Denied dermatology symptoms, rash and scar.  Neurologic: Denied neurology symptoms, dizziness, headache, neck pain and syncope.  Psychiatric: Denied psychiatric symptoms, anxiety and depression.  Endocrine: Denied endocrine symptoms including hot flashes and night sweats.   Meds:   Current  Outpatient Prescriptions on File Prior to Visit  Medication Sig Dispense Refill  . metroNIDAZOLE (FLAGYL) 500 MG tablet Take 1 tablet (500 mg total) by mouth 2 (two) times daily. 14 tablet 0   No current facility-administered medications on file prior to visit.     Objective:     Vitals:   05/23/17 1136  BP: 99/62  Pulse: 70              Ultrasound results reviewed directly with the patient  Assessment:    G1P0 There are no active problems to display for this patient.    1. Threatened abortion     Irregular sac low in the uterus without fetal pole.  Patient now having light bleeding. She is not convinced this is a missed AB and would like some more time to consider her options as well as have a follow-up ultrasound for confirmation of nonviable pregnancy.   Plan:            1.  At patient request have ordered a follow-up ultrasound for 1 week.  2.  SAb I have discussed the possibility of miscarriage with the patient.  I have informed her that vaginal bleeding, cramping or passage of tissue are the most common signs of miscarriage.  Should she have heavy bleeding, pass tissue or have other problems, she has been informed to call the office immediately.  I have advised her to remain at pelvic rest for at least one week after her last episode of bleeding.  If she is currently working, I have instructed her to discontinue until this situation resolves.  Complete versus incomplete miscarriage was discussed and the patient is aware that should she develop heavy  bleeding, fever, or persistent crampy pelvic pain she may require a D & E to remove the remaining portion of the miscarried pregnancy.  I advised her to keep me informed should her condition change.  Orders No orders of the defined types were placed in this encounter.   No orders of the defined types were placed in this encounter.     F/U  Return in about 1 week (around 05/30/2017). I spent 18 minutes with this patient of  which greater than 50% was spent discussing ultrasound findings, possibility of miscarriage, different types of miscarriage, possible management of blighted ovum. Elonda Husky, M.D. 05/23/2017 12:10 PM

## 2017-05-26 ENCOUNTER — Other Ambulatory Visit: Payer: Self-pay | Admitting: Obstetrics and Gynecology

## 2017-05-26 DIAGNOSIS — O200 Threatened abortion: Secondary | ICD-10-CM

## 2017-05-30 ENCOUNTER — Ambulatory Visit (INDEPENDENT_AMBULATORY_CARE_PROVIDER_SITE_OTHER): Payer: BLUE CROSS/BLUE SHIELD

## 2017-05-30 DIAGNOSIS — O200 Threatened abortion: Secondary | ICD-10-CM | POA: Diagnosis not present

## 2017-06-06 ENCOUNTER — Emergency Department
Admission: EM | Admit: 2017-06-06 | Discharge: 2017-06-06 | Disposition: A | Discharge status: Home / Self Care | Payer: BLUE CROSS/BLUE SHIELD | Attending: Student in an Organized Health Care Education/Training Program | Admitting: Student in an Organized Health Care Education/Training Program

## 2017-06-06 ENCOUNTER — Other Ambulatory Visit: Payer: Self-pay

## 2017-06-06 ENCOUNTER — Ambulatory Visit (INDEPENDENT_AMBULATORY_CARE_PROVIDER_SITE_OTHER): Payer: BLUE CROSS/BLUE SHIELD | Admitting: Obstetrics and Gynecology

## 2017-06-06 ENCOUNTER — Emergency Department: Payer: BLUE CROSS/BLUE SHIELD

## 2017-06-06 ENCOUNTER — Encounter: Payer: Self-pay | Admitting: Obstetrics and Gynecology

## 2017-06-06 VITALS — BP 90/56 | HR 70 | Ht 64.0 in | Wt 159.3 lb

## 2017-06-06 DIAGNOSIS — N939 Abnormal uterine and vaginal bleeding, unspecified: Secondary | ICD-10-CM

## 2017-06-06 DIAGNOSIS — Z3400 Encounter for supervision of normal first pregnancy, unspecified trimester: Secondary | ICD-10-CM | POA: Insufficient documentation

## 2017-06-06 DIAGNOSIS — O021 Missed abortion: Secondary | ICD-10-CM

## 2017-06-06 DIAGNOSIS — O039 Complete or unspecified spontaneous abortion without complication: Secondary | ICD-10-CM | POA: Diagnosis not present

## 2017-06-06 DIAGNOSIS — O9989 Other specified diseases and conditions complicating pregnancy, childbirth and the puerperium: Secondary | ICD-10-CM | POA: Diagnosis present

## 2017-06-06 LAB — COMPREHENSIVE METABOLIC PANEL
ALT: 17 U/L (ref 14–54)
ANION GAP: 5 (ref 5–15)
AST: 21 U/L (ref 15–41)
Albumin: 4.3 g/dL (ref 3.5–5.0)
Alkaline Phosphatase: 59 U/L (ref 38–126)
BUN: 10 mg/dL (ref 6–20)
CHLORIDE: 103 mmol/L (ref 101–111)
CO2: 28 mmol/L (ref 22–32)
CREATININE: 0.87 mg/dL (ref 0.44–1.00)
Calcium: 9.5 mg/dL (ref 8.9–10.3)
GFR calc non Af Amer: >60 mL/min (ref >60)
Glucose, Bld: 95 mg/dL (ref 65–99)
Potassium: 3.6 mmol/L (ref 3.5–5.1)
SODIUM: 136 mmol/L (ref 135–145)
Total Bilirubin: 0.5 mg/dL (ref 0.3–1.2)
Total Protein: 7.5 g/dL (ref 6.5–8.1)

## 2017-06-06 LAB — CBC
HCT: 34.9 % — ABNORMAL LOW (ref 35.0–47.0)
Hemoglobin: 12 g/dL (ref 12.0–16.0)
MCH: 27.7 pg (ref 26.0–34.0)
MCHC: 34.4 g/dL (ref 32.0–36.0)
MCV: 80.6 fL (ref 80.0–100.0)
PLATELETS: 239 10*3/uL (ref 150–440)
RBC: 4.33 MIL/uL (ref 3.80–5.20)
RDW: 14.6 % — ABNORMAL HIGH (ref 11.5–14.5)
WBC: 11.9 10*3/uL — ABNORMAL HIGH (ref 3.6–11.0)

## 2017-06-06 LAB — HCG, QUANTITATIVE, PREGNANCY: hCG, Beta Chain, Quant, S: 21638 m[IU]/mL — ABNORMAL HIGH (ref <5)

## 2017-06-06 MED ORDER — ACETAMINOPHEN 500 MG PO TABS
ORAL_TABLET | ORAL | Status: AC
  Filled 2017-06-06: qty 2

## 2017-06-06 MED ORDER — ACETAMINOPHEN 500 MG PO TABS
1000.0000 mg | ORAL_TABLET | Freq: Once | ORAL | Status: AC
  Administered 2017-06-06: 1000 mg via ORAL

## 2017-06-06 MED ORDER — HYDROCODONE-ACETAMINOPHEN 5-325 MG PO TABS: 1.0000 | ORAL_TABLET | ORAL | 0 refills | Status: DC | PRN

## 2017-06-06 MED ORDER — OXYCODONE-ACETAMINOPHEN 5-325 MG PO TABS
1.0000 | ORAL_TABLET | Freq: Once | ORAL | Status: AC
  Administered 2017-06-06: 1 via ORAL
  Filled 2017-06-06: qty 1

## 2017-06-06 MED ORDER — MISOPROSTOL 200 MCG PO TABS: ORAL_TABLET | ORAL | 0 refills | Status: DC

## 2017-06-06 MED ORDER — KETOROLAC TROMETHAMINE 30 MG/ML IJ SOLN
15.0000 mg | Freq: Once | INTRAMUSCULAR | Status: AC
  Administered 2017-06-06: 15 mg via INTRAVENOUS
  Filled 2017-06-06: qty 1

## 2017-06-06 NOTE — ED Provider Notes (Signed)
[facility] Emergency Department Provider Note    First MD Initiated Contact with Patient 06/06/17 2059     (approximate)  I have reviewed the triage vital signs and the nursing notes.   HISTORY  Chief Complaint Vaginal Bleeding    HPI Mary Curry is a 26 y.o. female G1P0 presents due to pelvic cramping and vaginal bleeding.  Patient recently seen in outpatient OB/GYN and diagnosed with inevitable or nonviable pregnancy and was given prescription for Cytotec after discussing options for expectant management medication initiation versus operative management.  She elected expectant and has not filled her Cytotec.  Denies passing any clots but states that the pain is gotten worse.  Is not taking anything for the pain.  Denies any fevers.    Past Medical History:  Diagnosis Date  . Medical history non-contributory    No family history on file. Past Surgical History:  Procedure Laterality Date  . NO PAST SURGERIES     There are no active problems to display for this patient.     Prior to Admission medications   Medication Sig Start Date End Date Taking? Authorizing Provider  misoprostol (CYTOTEC) 200 MCG tablet Place four tablets in between your gums and cheeks (two tablets on each side) as instructed - repeat in 3 hours if needed. 06/06/17   Linzie CollinEvans, David James, MD    Allergies Patient has no known allergies.    Social History Social History   Tobacco Use  . Smoking status: Never Smoker  . Smokeless tobacco: Never Used  Substance Use Topics  . Alcohol use: No  . Drug use: No    Review of Systems Patient denies headaches, rhinorrhea, blurry vision, numbness, shortness of breath, chest pain, edema, cough, abdominal pain, nausea, vomiting, diarrhea, dysuria, fevers, rashes or hallucinations unless otherwise stated above in HPI. ____________________________________________   PHYSICAL EXAM:  VITAL SIGNS: Vitals:   06/06/17 1919  BP:  113/85  Pulse: 100  Resp: 16  Temp: 98.8 F (37.1 C)  SpO2: 100%    Constitutional: Alert and oriented. Well appearing and in no acute distress. Eyes: Conjunctivae are normal.  Head: Atraumatic. Nose: No congestion/rhinnorhea. Mouth/Throat: Mucous membranes are moist.   Neck: No stridor. Painless ROM.  Cardiovascular: Normal rate, regular rhythm. Grossly normal heart sounds.  Good peripheral circulation. Respiratory: Normal respiratory effort.  No retractions. Lungs CTAB. Gastrointestinal: Soft and nontender. No distention. No abdominal bruits. No CVA tenderness. Genitourinary: Cervix is open with no active hemorrhage but there is is is coming from the cervix.  Small what appeared to be products of conception in the vaginal vault which were removed with ring forceps. Musculoskeletal: No lower extremity tenderness nor edema.  No joint effusions. Neurologic:  Normal speech and language. No gross focal neurologic deficits are appreciated. No facial droop Skin:  Skin is warm, dry and intact. No rash noted. Psychiatric: Mood and affect are normal. Speech and behavior are normal.  ____________________________________________   LABS (all labs ordered are listed, but only abnormal results are displayed)  Results for orders placed or performed during the hospital encounter of 06/06/17 (from the past 24 hour(s))  hCG, quantitative, pregnancy     Status: Abnormal   Collection Time: 06/06/17  7:27 PM  Result Value Ref Range   hCG, Beta Chain, Quant, S 21,638 (H) <5 mIU/mL  CBC     Status: Abnormal   Collection Time: 06/06/17  7:27 PM  Result Value Ref Range   WBC 11.9 (H) 3.6 -  11.0 K/uL   RBC 4.33 3.80 - 5.20 MIL/uL   Hemoglobin 12.0 12.0 - 16.0 g/dL   HCT 11.934.9 (L) 14.735.0 - 82.947.0 %   MCV 80.6 80.0 - 100.0 fL   MCH 27.7 26.0 - 34.0 pg   MCHC 34.4 32.0 - 36.0 g/dL   RDW 56.214.6 (H) 13.011.5 - 86.514.5 %   Platelets 239 150 - 440 K/uL  Comprehensive metabolic panel     Status: None   Collection  Time: 06/06/17  7:27 PM  Result Value Ref Range   Sodium 136 135 - 145 mmol/L   Potassium 3.6 3.5 - 5.1 mmol/L   Chloride 103 101 - 111 mmol/L   CO2 28 22 - 32 mmol/L   Glucose, Bld 95 65 - 99 mg/dL   BUN 10 6 - 20 mg/dL   Creatinine, Ser 7.840.87 0.44 - 1.00 mg/dL   Calcium 9.5 8.9 - 69.610.3 mg/dL   Total Protein 7.5 6.5 - 8.1 g/dL   Albumin 4.3 3.5 - 5.0 g/dL   AST 21 15 - 41 U/L   ALT 17 14 - 54 U/L   Alkaline Phosphatase 59 38 - 126 U/L   Total Bilirubin 0.5 0.3 - 1.2 mg/dL   GFR calc non Af Amer >60 >60 mL/min   GFR calc Af Amer >60 >60 mL/min   Anion gap 5 5 - 15   ____________________________________________ ____________________________________________  RADIOLOGY  I personally reviewed all radiographic images ordered to evaluate for the above acute complaints and reviewed radiology reports and findings.  These findings were personally discussed with the patient.  Please see medical record for radiology report.  ____________________________________________   PROCEDURES  Procedure(s) performed:  Procedures    Critical Care performed: no ____________________________________________   INITIAL IMPRESSION / ASSESSMENT AND PLAN / ED COURSE  Pertinent labs & imaging results that were available during my care of the patient were reviewed by me and considered in my medical decision making (see chart for details).  DDX: abruption, ectopic, miscarriage  Mary Curry is a 26 y.o. who presents to the ED with inevitable miscarriage presents with vaginal bleeding and pelvic pain as described above.  Ultrasound confirms miscarriage.  No fevers.  Pelvic exam as above.  Patient has prescription for Cytotec and does want to start taking this.  Will give prescription for pain pain medication.  Patient has follow-up with OB/GYN.  Discussed strict return precautions.      ____________________________________________   FINAL CLINICAL IMPRESSION(S) / ED DIAGNOSES  Final diagnoses:    Inevitable complete miscarriage without complication      NEW MEDICATIONS STARTED DURING THIS VISIT:  This SmartLink is deprecated. Use AVSMEDLIST instead to display the medication list for a patient.   Note:  This document was prepared using Dragon voice recognition software and may include unintentional dictation errors.    Willy Eddyobinson, Arnetia Bronk, MD 06/06/17 2224

## 2017-06-06 NOTE — ED Notes (Signed)
Pt states that she is [redacted] weeks pregnant and having abd cramping and bleeding - pt did not inform this nurse that she has had an ultrasound and been told that the pregnancy is not viable (per ultrasound tech)

## 2017-06-06 NOTE — ED Triage Notes (Signed)
Pt is approx [redacted] weeks pregnant with severe lower abd pain and vag bleeding. Pt states saw pmd today but told her it is normal pregnancy pain. Pt co 10/10 pain at this itme.

## 2017-06-06 NOTE — Progress Notes (Signed)
HPI:      [name] is a 26 y.o. G1P0 who LMP was [name] was 03/17/2017 (exact date).  Subjective:   She presents today and she continues to have a small amount of spotting daily.  Her ultrasound revealed a nonviable pregnancy size much less than dates.  This is her second ultrasound revealing nonviable intrauterine pregnancy.    Hx: The following portions of the patient's history were reviewed and updated as appropriate:             She  has a past medical history of Medical history non-contributory. She does not have a problem list on file. She  has a past surgical history that includes No past surgeries. Her family history is not on file. She  reports that  has never smoked. she has never used smokeless tobacco. She reports that she does not drink alcohol or use drugs. She has No Known Allergies.       Review of Systems:  Review of Systems  Constitutional: Denied constitutional symptoms, night sweats, recent illness, fatigue, fever, insomnia and weight loss.  Eyes: Denied eye symptoms, eye pain, photophobia, vision change and visual disturbance.  Ears/Nose/Throat/Neck: Denied ear, nose, throat or neck symptoms, hearing loss, nasal discharge, sinus congestion and sore throat.  Cardiovascular: Denied cardiovascular symptoms, arrhythmia, chest pain/pressure, edema, exercise intolerance, orthopnea and palpitations.  Respiratory: Denied pulmonary symptoms, asthma, pleuritic pain, productive sputum, cough, dyspnea and wheezing.  Gastrointestinal: Denied, gastro-esophageal reflux, melena, nausea and vomiting.  Genitourinary: See HPI for additional information.  Musculoskeletal: Denied musculoskeletal symptoms, stiffness, swelling, muscle weakness and myalgia.  Dermatologic: Denied dermatology symptoms, rash and scar.  Neurologic: Denied neurology symptoms, dizziness, headache, neck pain and syncope.  Psychiatric: Denied psychiatric symptoms, anxiety and  depression.  Endocrine: Denied endocrine symptoms including hot flashes and night sweats.   Meds:   No current outpatient medications on file prior to visit.   No current facility-administered medications on file prior to visit.     Objective:     Vitals:   06/06/17 1111  BP: (!) 90/56  Pulse: 70            The ultrasound was reviewed in detail with her and compared with her previous ultrasound.  Assessment:    G1P0 There are no active problems to display for this patient.    1. Missed abortion        Plan:            1.  Multiple options for management of missed AB discussed in detail.  Medical and surgical options risks and benefits reviewed.  Patient is chosen medical option of misoprostol.  We have discussed in detail.  Orders No orders of the defined types were placed in this encounter.    Meds ordered this encounter  Medications  . misoprostol (CYTOTEC) 200 MCG tablet    Sig: Place four tablets in between your gums and cheeks (two tablets on each side) as instructed - repeat in 3 hours if needed.    Dispense:  8 tablet    Refill:  0      F/U  Return in about 2 weeks (around 06/20/2017). I spent 18 minutes with this patient of which greater than 50% was spent discussing missed AB diagnosis and management as listed above.  All questions answered.  Patient's concerns addressed. Elonda Huskyavid J. Karessa Onorato, M.D. 06/06/2017 12:13 PM

## 2017-06-09 ENCOUNTER — Encounter: Payer: Self-pay | Admitting: Obstetrics and Gynecology

## 2017-06-16 ENCOUNTER — Ambulatory Visit (INDEPENDENT_AMBULATORY_CARE_PROVIDER_SITE_OTHER): Payer: Medicaid Other | Admitting: Obstetrics & Gynecology

## 2017-06-16 VITALS — BP 110/80 | HR 75 | Ht 64.0 in | Wt 156.0 lb

## 2017-06-16 DIAGNOSIS — O021 Missed abortion: Secondary | ICD-10-CM | POA: Diagnosis not present

## 2017-06-16 NOTE — Progress Notes (Signed)
Obstetric Problem Visit   Chief Complaint:  Chief Complaint  Patient presents with  . Threatened Miscarriage    History of Present Illness: Patient is a 26 y.o. G1P0 3353w0d presenting for first trimester bleeding.  The onset of bleeding was a few weeks ago and it has been sporadic.  Is bleeding equal to or greater than normal menstrual flow:  Yes Any recent trauma:  No Recent intercourse:  No History of prior miscarriage:  No Prior ultrasound demonstrating IUP:  Yes.  US x3 over last 4 weeks w no FHT no growth and findings c/w mussed ab (Dr Logan BoresEvans office) Prior ultrasound demonstrating viable IUP:  No Prior Serum HCG:  Yes.  48,000 followed a week later of 21,000 Rh status: Pos  PMHx: She  has a past medical history of Medical history non-contributory. Also,  has a past surgical history that includes No past surgeries., family history is not on file.,  reports that  has never smoked. she has never used smokeless tobacco. She reports that she does not drink alcohol or use drugs.  She has a current medication list which includes the following prescription(s): hydrocodone-acetaminophen. Also, has No Known Allergies.  Review of Systems  Constitutional: Negative for chills, fever and malaise/fatigue.  HENT: Negative for congestion, sinus pain and sore throat.   Eyes: Negative for blurred vision and pain.  Respiratory: Negative for cough and wheezing.   Cardiovascular: Negative for chest pain and leg swelling.  Gastrointestinal: Negative for abdominal pain, constipation, diarrhea, heartburn, nausea and vomiting.  Genitourinary: Negative for dysuria, frequency, hematuria and urgency.  Musculoskeletal: Negative for back pain, joint pain, myalgias and neck pain.  Skin: Negative for itching and rash.  Neurological: Negative for dizziness, tremors and weakness.  Endo/Heme/Allergies: Does not bruise/bleed easily.  Psychiatric/Behavioral: Negative for depression. The patient is not  nervous/anxious and does not have insomnia.     Objective: Vitals:   06/16/17 0800  BP: 110/80  Pulse: 75   Physical Exam  Constitutional: She is oriented to person, place, and time. She appears well-developed and well-nourished. No distress.  Genitourinary: Vagina normal and uterus normal. Pelvic exam was performed with patient supine. There is no rash, tenderness or lesion on the right labia. There is no rash, tenderness or lesion on the left labia. No erythema or bleeding in the vagina. Right adnexum does not display mass and does not display tenderness. Left adnexum does not display mass and does not display tenderness. Cervix does not exhibit motion tenderness, discharge, polyp or nabothian cyst.   Uterus is mobile and midaxial. Uterus is not enlarged or exhibiting a mass.  Genitourinary Comments: Old blood (dark) at os; no dilation  Abdominal: Soft. She exhibits no distension. There is no tenderness.  Musculoskeletal: Normal range of motion.  Neurological: She is alert and oriented to person, place, and time. No cranial nerve deficit.  Skin: Skin is warm and dry.  Psychiatric: She has a normal mood and affect.   Assessment: 26 y.o. G1P0 10953w0d but no signs pregnancy ever got this far (prior US at 5 or 6 weeks, never w FHT, beta declining) 1. Missed abortion - Beta HCG, Quant  1) First trimester bleeding - incidence and clinical course of first trimester bleeding is discussed in detail with the patient today.  Approximately 1/3 of pregnancies ending in live births experienced 1st trimester bleeding.  The amount of bleeding is variable and not necessarily predictive of outcome.  Sources may be cervical or uterine.  Subchorionic hemorrhages are  a frequent concurrent findings on ultrasound and are followed expectantly.  These often absorb or regress spontaneously although risk for expansion and further disruption of the utero-placental interface leading to miscarriage is possible.  There is  no clearly documented benefit to limiting or modifying activity and sexual intercourse in altering clinic course of 1st trimester bleeding.    2) The patient is Rh POS, rhogam is therefore no indicated to decrease the risk rhesus alloimmunization.    3) Routine bleeding precautions were discussed with the patient prior the conclusion of today's visit.  4) If beta declining still then await natural resolution of missed ab to complete ab.  5) Desires pregnancy attempt again soon.  Recommendations given for timing and follow up.  Annamarie MajorPaul Zuriyah Shatz, MD, Merlinda FrederickFACOG [facility], [facility] 06/16/2017  9:59 AM

## 2017-06-16 NOTE — Patient Instructions (Signed)

## 2017-06-17 LAB — BETA HCG QUANT (REF LAB): HCG QUANT: 1180 m[IU]/mL

## 2017-06-19 NOTE — Progress Notes (Signed)
Let her know preg hormone level coming down significantly consistant with normal resolving miscarriage; should stop bleeding soon and have period in 4-6 weeks.

## 2017-06-20 ENCOUNTER — Encounter: Payer: BLUE CROSS/BLUE SHIELD | Admitting: Obstetrics and Gynecology

## 2017-06-21 ENCOUNTER — Ambulatory Visit: Payer: Medicaid Other | Admitting: Maternal Newborn

## 2017-06-27 ENCOUNTER — Ambulatory Visit: Payer: Medicaid Other | Admitting: Obstetrics and Gynecology

## 2017-06-27 ENCOUNTER — Ambulatory Visit (INDEPENDENT_AMBULATORY_CARE_PROVIDER_SITE_OTHER): Payer: BLUE CROSS/BLUE SHIELD | Admitting: Maternal Newborn

## 2017-06-27 ENCOUNTER — Encounter: Payer: Self-pay | Admitting: Maternal Newborn

## 2017-06-27 ENCOUNTER — Other Ambulatory Visit: Payer: Self-pay | Admitting: Maternal Newborn

## 2017-06-27 VITALS — BP 114/70 | Ht 64.0 in | Wt 160.0 lb

## 2017-06-27 DIAGNOSIS — O034 Incomplete spontaneous abortion without complication: Secondary | ICD-10-CM | POA: Insufficient documentation

## 2017-06-27 DIAGNOSIS — N949 Unspecified condition associated with female genital organs and menstrual cycle: Secondary | ICD-10-CM | POA: Diagnosis not present

## 2017-06-27 NOTE — Progress Notes (Signed)
Obstetrics & Gynecology Office Visit   Chief Complaint: Vaginal bleeding and odor   History of Present Illness: She has been having vaginal bleeding since 05/23/2017 and was diagnosed with a threatened miscarriage at that time. Serial ultrasounds showed a non-viable pregnancy. She was prescribed Cytotec at [facility] on 06/06/2017, but she did not take it, and the bleeding continued.  [facility] was [facility]/2017 with findings consistent with missed abortion. She has continued to have cramping and bleeding daily since then, and has noticed in the last week that the discharge has a bad odor. She denies fevers. She states that she wants a resolution for this miscarriage as she is scheduled to go out of town on a work related trip and will not have access to her regular care providers.   Review of Systems: 10 point review of systems negative unless otherwise noted in HPI.  Past Medical History:  Past Medical History:  Diagnosis Date  . Medical history non-contributory     Past Surgical History:  Past Surgical History:  Procedure Laterality Date  . NO PAST SURGERIES      Gynecologic History: [name] was 03/17/2017.  Obstetric History: G1P0  Family History:  History reviewed. No pertinent family history.  Social History:  Social History   Socioeconomic History  . Marital status: Single    Spouse name: Not on file  . Number of children: Not on file  . Years of education: Not on file  . Highest education level: Not on file  Social Needs  . Financial resource strain: Not on file  . Food insecurity - worry: Not on file  . Food insecurity - inability: Not on file  . Transportation needs - medical: Not on file  . Transportation needs - non-medical: Not on file  Occupational History  . Not on file  Tobacco Use  . Smoking status: Never Smoker  . Smokeless tobacco: Never Used  Substance and Sexual Activity  . Alcohol use: No  .  Drug use: No  . Sexual activity: No  Other Topics Concern  . Not on file  Social History Narrative  . Not on file    Allergies:  No Known Allergies  Medications: Prior to Admission medications   Medication Sig Start Date End Date Taking? Authorizing Provider  HYDROcodone-acetaminophen (NORCO) 5-325 MG tablet Take 1 tablet every 4 (four) hours as needed by mouth for moderate pain. Patient not taking: Reported on 06/16/2017 06/06/17   Willy Eddyobinson, Patrick, MD    Physical Exam Vitals: There were no vitals filed for this visit. [name] was 03/17/2017.  General: NAD, alert, cooperative HEENT: normocephalic, anicteric Pulmonary: No increased work of breathing Abdomen: NABS, soft, non-tender, non-distended.   Genitourinary:  External: Normal external female genitalia.  Normal urethral  meatus, normal Bartholin's and Skene's glands.    Vagina: Normal vaginal mucosa, no evidence of prolapse.    Cervix: Very tender on speculum exam, small amount of dark  blood at os, no dilation.  Uterus: Non-enlarged, mobile, normal contour.  CMT present  Adnexa: ovaries non-enlarged, no adnexal masses  Rectal: deferred Extremities: no edema, erythema, or tenderness Neurologic: Grossly intact Psychiatric: mood appropriate, affect full  Assessment: 26 y.o. G1P0 with symptoms consistent with an incomplete abortion and possible developing infection.  Plan: Problem List Items Addressed This Visit    None    Visit Diagnoses    Incomplete abortion       Relevant Orders  Beta HCG   US PELVIS TRANSVANGINAL NON-OB (TV ONLY)   Cervical motion tenderness       Relevant Orders   NuSwab Vaginitis Plus (VG+)     Patient to have beta HCG drawn today, and follow-up tomorrow for ultrasound to determine uterine contents. Discussed that D&C may be indicated depending on findings, and patient would be amenable to this option as she desires for her symptoms to be relieved. Patient to see  MD tomorrow to more thoroughly discuss surgical option as appropriate.  Marcelyn BruinsJacelyn Kiran Carline, CNM 06/27/2017  1:27 PM

## 2017-06-28 ENCOUNTER — Encounter: Payer: Self-pay | Admitting: Obstetrics & Gynecology

## 2017-06-28 ENCOUNTER — Ambulatory Visit (INDEPENDENT_AMBULATORY_CARE_PROVIDER_SITE_OTHER): Payer: BLUE CROSS/BLUE SHIELD | Admitting: Obstetrics & Gynecology

## 2017-06-28 VITALS — BP 90/60 | HR 65 | Ht 64.0 in | Wt 158.0 lb

## 2017-06-28 DIAGNOSIS — O034 Incomplete spontaneous abortion without complication: Secondary | ICD-10-CM | POA: Diagnosis not present

## 2017-06-28 LAB — BETA HCG QUANT (REF LAB): HCG QUANT: 121 m[IU]/mL

## 2017-06-28 NOTE — Patient Instructions (Signed)
Dilation and Curettage or Vacuum Curettage, Care After °This sheet gives you information about how to care for yourself after your procedure. Your health care provider may also give you more specific instructions. If you have problems or questions, contact your health care provider. °What can I expect after the procedure? °After your procedure, it is common to have: °· Mild pain or cramping. °· Some vaginal bleeding or spotting. ° °These may last for up to 2 weeks after your procedure. °Follow these instructions at home: °Activity ° °· Do not drive or use heavy machinery while taking prescription pain medicine. °· Avoid driving for the first 24 hours after your procedure. °· Take frequent, short walks, followed by rest periods, throughout the day. Ask your health care provider what activities are safe for you. After 1-2 days, you may be able to return to your normal activities. °· Do not lift anything heavier than 10 lb (4.5 kg) until your health care provider approves. °· For at least 2 weeks, or as long as told by your health care provider, do not: °? Douche. °? Use tampons. °? Have sexual intercourse. °General instructions ° °· Take over-the-counter and prescription medicines only as told by your health care provider. This is especially important if you take blood thinning medicine. °· Do not take baths, swim, or use a hot tub until your health care provider approves. Take showers instead of baths. °· Wear compression stockings as told by your health care provider. These stockings help to prevent blood clots and reduce swelling in your legs. °· It is your responsibility to get the results of your procedure. Ask your health care provider, or the department performing the procedure, when your results will be ready. °· Keep all follow-up visits as told by your health care provider. This is important. °Contact a health care provider if: °· You have severe cramps that get worse or that do not get better with  medicine. °· You have severe abdominal pain. °· You cannot drink fluids without vomiting. °· You develop pain in a different area of your pelvis. °· You have bad-smelling vaginal discharge. °· You have a rash. °Get help right away if: °· You have vaginal bleeding that soaks more than one sanitary pad in 1 hour, for 2 hours in a row. °· You pass large blood clots from your vagina. °· You have a fever that is above 100.4°F (38.0°C). °· Your abdomen feels very tender or hard. °· You have chest pain. °· You have shortness of breath. °· You cough up blood. °· You feel dizzy or light-headed. °· You faint. °· You have pain in your neck or shoulder area. °This information is not intended to replace advice given to you by your health care provider. Make sure you discuss any questions you have with your health care provider. °Document Released: 07/15/2000 Document Revised: 03/16/2016 Document Reviewed: 02/18/2016 °Elsevier Interactive Patient Education © 2018 [employer] ° °

## 2017-06-28 NOTE — Progress Notes (Signed)
PRE-OPERATIVE HISTORY AND PHYSICAL EXAM  HPI:  Mary Curry is a 26 y.o. G1P0 [name] was 03/17/2017.; she is being admitted for surgery related to incomplete miscarriage- bleeding consistent, odorous, clots.  Also has severe pain at times.  Traveling out of country next week.  Last Beta 121.Marland Kitchen.  PMHx: Past Medical History:  Diagnosis Date  . Medical history non-contributory    Past Surgical History:  Procedure Laterality Date  . NO PAST SURGERIES     History reviewed. No pertinent family history. Social History   Tobacco Use  . Smoking status: Never Smoker  . Smokeless tobacco: Never Used  Substance Use Topics  . Alcohol use: No  . Drug use: No    Current Outpatient Medications:  .  HYDROcodone-acetaminophen (NORCO) 5-325 MG tablet, Take 1 tablet every 4 (four) hours as needed by mouth for moderate pain. (Patient not taking: Reported on 06/16/2017), Disp: 6 tablet, Rfl: 0 Allergies: Patient has no known allergies.  Review of Systems  Constitutional: Negative for chills, fever and malaise/fatigue.  HENT: Negative for congestion, sinus pain and sore throat.   Eyes: Negative for blurred vision and pain.  Respiratory: Negative for cough and wheezing.   Cardiovascular: Negative for chest pain and leg swelling.  Gastrointestinal: Negative for abdominal pain, constipation, diarrhea, heartburn, nausea and vomiting.  Genitourinary: Negative for dysuria, frequency, hematuria and urgency.  Musculoskeletal: Negative for back pain, joint pain, myalgias and neck pain.  Skin: Negative for itching and rash.  Neurological: Negative for dizziness, tremors and weakness.  Endo/Heme/Allergies: Does not bruise/bleed easily.  Psychiatric/Behavioral: Negative for depression. The patient is not nervous/anxious and does not have insomnia.     Objective: BP 90/60   Pulse 65   Ht 5\' 4"  (1.626 m)   Wt 158 lb (71.7 kg)   LMP 03/17/2017   BMI 27.12 kg/m   Filed  Weights   06/28/17 0830  Weight: 158 lb (71.7 kg)   Physical Exam  Constitutional: She is oriented to person, place, and time. She appears well-developed and well-nourished. No distress.  Genitourinary: Rectum normal, vagina normal and uterus normal. Pelvic exam was performed with patient supine. There is no rash or lesion on the right labia. There is no rash or lesion on the left labia. Vagina exhibits no lesion. No bleeding in the vagina. Right adnexum does not display mass and does not display tenderness. Left adnexum does not display mass and does not display tenderness. Cervix does not exhibit motion tenderness, lesion, friability or polyp.   Uterus is mobile and midaxial. Uterus is not enlarged or exhibiting a mass.  HENT:  Head: Normocephalic and atraumatic. Head is without laceration.  Right Ear: Hearing normal.  Left Ear: Hearing normal.  Nose: No epistaxis.  No foreign bodies.  Mouth/Throat: Uvula is midline, oropharynx is clear and moist and mucous membranes are normal.  Eyes: Pupils are equal, round, and reactive to light.  Neck: Normal range of motion. Neck supple. No thyromegaly present.  Cardiovascular: Normal rate and regular rhythm. Exam reveals no gallop and no friction rub.  No murmur heard. Pulmonary/Chest: Effort normal and breath sounds normal. No respiratory distress. She has no wheezes. Right breast exhibits no mass, no skin change and no tenderness. Left breast exhibits no mass, no skin change and no tenderness.  Abdominal: Soft. Bowel sounds are normal. She exhibits no distension. There is no tenderness. There is no rebound.  Musculoskeletal: Normal range of motion.  Neurological: She is  alert and oriented to person, place, and time. No cranial nerve deficit.  Skin: Skin is warm and dry.  Psychiatric: She has a normal mood and affect. Judgment normal.  Vitals reviewed.   Assessment: 1. Incomplete abortion   D&C consent  I have had a careful discussion with  this patient about all the options available and the risk/benefits of each. I have fully informed this patient that surgery may subject her to a variety of discomforts and risks: She understands that most patients have surgery with little difficulty, but problems can happen ranging from minor to fatal. These include nausea, vomiting, pain, bleeding, infection, poor healing, hernia, or formation of adhesions. Unexpected reactions may occur from any drug or anesthetic given. Unintended injury may occur to other pelvic or abdominal structures such as Fallopian tubes, ovaries, bladder, ureter (tube from kidney to bladder), or bowel. Nerves going from the pelvis to the legs may be injured. Any such injury may require immediate or later additional surgery to correct the problem. Excessive blood loss requiring transfusion is very unlikely but possible. Dangerous blood clots may form in the legs or lungs. Physical and sexual activity will be restricted in varying degrees for an indeterminate period of time but most often 2-6 weeks.  Finally, she understands that it is impossible to list every possible undesirable effect and that the condition for which surgery is done is not always cured or significantly improved, and in rare cases may be even worse.Ample time was given to answer all questions.  Annamarie MajorPaul Merrel Crabbe, MD, Merlinda FrederickFACOG [facility], [facility] 06/28/2017  9:07 AM

## 2017-06-29 ENCOUNTER — Encounter
Admission: RE | Disposition: A | Discharge status: Home / Self Care | Payer: Self-pay | Source: Ambulatory Visit | Attending: Obstetrics and Gynecology

## 2017-06-29 ENCOUNTER — Ambulatory Visit: Payer: BLUE CROSS/BLUE SHIELD | Admitting: Certified Registered Nurse Anesthetist

## 2017-06-29 ENCOUNTER — Ambulatory Visit
Admission: RE | Admit: 2017-06-29 | Discharge: 2017-06-29 | Disposition: A | Discharge status: Home / Self Care | Payer: BLUE CROSS/BLUE SHIELD | Source: Ambulatory Visit | Attending: Obstetrics and Gynecology | Admitting: Obstetrics and Gynecology

## 2017-06-29 DIAGNOSIS — Z9889 Other specified postprocedural states: Secondary | ICD-10-CM

## 2017-06-29 DIAGNOSIS — O034 Incomplete spontaneous abortion without complication: Secondary | ICD-10-CM | POA: Insufficient documentation

## 2017-06-29 HISTORY — PX: DILATION AND CURETTAGE OF UTERUS: SHX78

## 2017-06-29 SURGERY — DILATION AND CURETTAGE
Anesthesia: General | Wound class: Clean Contaminated

## 2017-06-29 MED ORDER — MIDAZOLAM HCL 2 MG/2ML IJ SOLN
INTRAMUSCULAR | Status: DC | PRN
  Administered 2017-06-29: 2 mg via INTRAVENOUS

## 2017-06-29 MED ORDER — LACTATED RINGERS IV SOLN
INTRAVENOUS | Status: DC
  Administered 2017-06-29: 12:00:00 via INTRAVENOUS

## 2017-06-29 MED ORDER — LIDOCAINE HCL (PF) 2 % IJ SOLN
INTRAMUSCULAR | Status: AC
  Filled 2017-06-29: qty 10

## 2017-06-29 MED ORDER — FENTANYL CITRATE (PF) 100 MCG/2ML IJ SOLN
25.0000 ug | INTRAMUSCULAR | Status: AC | PRN
  Administered 2017-06-29 (×6): 25 ug via INTRAVENOUS

## 2017-06-29 MED ORDER — EPHEDRINE SULFATE 50 MG/ML IJ SOLN
INTRAMUSCULAR | Status: DC | PRN
  Administered 2017-06-29: 10 mg via INTRAVENOUS

## 2017-06-29 MED ORDER — IBUPROFEN 600 MG PO TABS: 600.0000 mg | ORAL_TABLET | Freq: Four times a day (QID) | ORAL | 3 refills | Status: DC | PRN

## 2017-06-29 MED ORDER — FENTANYL CITRATE (PF) 100 MCG/2ML IJ SOLN
INTRAMUSCULAR | Status: AC
  Administered 2017-06-29: 25 ug via INTRAVENOUS
  Filled 2017-06-29: qty 2

## 2017-06-29 MED ORDER — FENTANYL CITRATE (PF) 100 MCG/2ML IJ SOLN
INTRAMUSCULAR | Status: DC | PRN
  Administered 2017-06-29 (×2): 50 ug via INTRAVENOUS

## 2017-06-29 MED ORDER — ONDANSETRON HCL 4 MG/2ML IJ SOLN
INTRAMUSCULAR | Status: DC | PRN
  Administered 2017-06-29: 4 mg via INTRAVENOUS

## 2017-06-29 MED ORDER — DOXYCYCLINE HYCLATE 100 MG IV SOLR
200.0000 mg | INTRAVENOUS | Status: AC
  Administered 2017-06-29: 200 mg via INTRAVENOUS
  Filled 2017-06-29: qty 200

## 2017-06-29 MED ORDER — FENTANYL CITRATE (PF) 100 MCG/2ML IJ SOLN
INTRAMUSCULAR | Status: AC
  Filled 2017-06-29: qty 2

## 2017-06-29 MED ORDER — ONDANSETRON HCL 4 MG/2ML IJ SOLN: 4.0000 mg | Freq: Once | INTRAMUSCULAR | Status: DC | PRN

## 2017-06-29 MED ORDER — DEXAMETHASONE SODIUM PHOSPHATE 10 MG/ML IJ SOLN
INTRAMUSCULAR | Status: AC
  Filled 2017-06-29: qty 1

## 2017-06-29 MED ORDER — ONDANSETRON HCL 4 MG/2ML IJ SOLN
INTRAMUSCULAR | Status: AC
  Filled 2017-06-29: qty 2

## 2017-06-29 MED ORDER — MIDAZOLAM HCL 2 MG/2ML IJ SOLN
INTRAMUSCULAR | Status: AC
  Filled 2017-06-29: qty 2

## 2017-06-29 MED ORDER — PROPOFOL 10 MG/ML IV BOLUS
INTRAVENOUS | Status: AC
  Filled 2017-06-29: qty 20

## 2017-06-29 MED ORDER — LIDOCAINE HCL (CARDIAC) 20 MG/ML IV SOLN
INTRAVENOUS | Status: DC | PRN
  Administered 2017-06-29: 100 mg via INTRAVENOUS

## 2017-06-29 MED ORDER — HYDROCODONE-ACETAMINOPHEN 5-325 MG PO TABS: 1.0000 | ORAL_TABLET | ORAL | 0 refills | Status: DC | PRN

## 2017-06-29 MED ORDER — DEXAMETHASONE SODIUM PHOSPHATE 10 MG/ML IJ SOLN
INTRAMUSCULAR | Status: DC | PRN
  Administered 2017-06-29: 10 mg via INTRAVENOUS

## 2017-06-29 MED ORDER — PROPOFOL 10 MG/ML IV BOLUS
INTRAVENOUS | Status: DC | PRN
  Administered 2017-06-29: 150 mg via INTRAVENOUS
  Administered 2017-06-29: 50 mg via INTRAVENOUS

## 2017-06-29 SURGICAL SUPPLY — 19 items
CATH ROBINSON RED A/P 16FR (CATHETERS) ×4 IMPLANT
FILTER UTR ASPR SPEC (MISCELLANEOUS) IMPLANT
FLTR UTR ASPR SPEC (MISCELLANEOUS)
GLOVE BIO SURGEON STRL SZ7 (GLOVE) ×4 IMPLANT
GLOVE INDICATOR 7.5 STRL GRN (GLOVE) ×2 IMPLANT
GOWN STRL REUS W/ TWL LRG LVL3 (GOWN DISPOSABLE) ×4 IMPLANT
GOWN STRL REUS W/TWL LRG LVL3 (GOWN DISPOSABLE) ×4
KIT BERKELEY 1ST TRIMESTER 3/8 (MISCELLANEOUS) IMPLANT
KIT RM TURNOVER CYSTO AR (KITS) ×4 IMPLANT
NS IRRIG 500ML POUR BTL (IV SOLUTION) ×2 IMPLANT
PACK DNC HYST (MISCELLANEOUS) ×4 IMPLANT
PAD OB MATERNITY 4.3X12.25 (PERSONAL CARE ITEMS) ×4 IMPLANT
PAD PREP 24X41 OB/GYN DISP (PERSONAL CARE ITEMS) ×4 IMPLANT
SET BERKELEY SUCTION TUBING (SUCTIONS) IMPLANT
TOWEL OR 17X26 4PK STRL BLUE (TOWEL DISPOSABLE) ×2 IMPLANT
VACURETTE 10 RIGID CVD (CANNULA) IMPLANT
VACURETTE 12 RIGID CVD (CANNULA) IMPLANT
VACURETTE 8 RIGID CVD (CANNULA) IMPLANT
VACURETTE 8MM F TIP (MISCELLANEOUS) IMPLANT

## 2017-06-29 NOTE — H&P (Signed)
Date of Initial H&P: 06/28/2018  History reviewed, patient examined, no change in status, stable for surgery.

## 2017-06-29 NOTE — Anesthesia Preprocedure Evaluation (Signed)
Anesthesia Evaluation  Patient identified by MRN, date of birth, ID band Patient awake    Reviewed: Allergy & Precautions, H&P , NPO status , Patient's Chart, lab work & pertinent test results, reviewed documented beta blocker date and time   Airway Mallampati: II  TM Distance: >3 FB Neck ROM: full    Dental  (+) Poor Dentition   Pulmonary neg pulmonary ROS,    Pulmonary exam normal        Cardiovascular Exercise Tolerance: Good negative cardio ROS Normal cardiovascular exam Rhythm:regular Rate:Normal     Neuro/Psych negative neurological ROS  negative psych ROS   GI/Hepatic negative GI ROS, Neg liver ROS,   Endo/Other  negative endocrine ROS  Renal/GU negative Renal ROS  negative genitourinary   Musculoskeletal   Abdominal   Peds  Hematology negative hematology ROS (+)   Anesthesia Other Findings Past Medical History: No date: Medical history non-contributory Past Surgical History: No date: NO PAST SURGERIES BMI    Body Mass Index:  27.12 kg/m     Reproductive/Obstetrics negative OB ROS                             Anesthesia Physical Anesthesia Plan  ASA: II  Anesthesia Plan: General   Post-op Pain Management:    Induction:   PONV Risk Score and Plan:   Airway Management Planned:   Additional Equipment:   Intra-op Plan:   Post-operative Plan:   Informed Consent: I have reviewed the patients History and Physical, chart, labs and discussed the procedure including the risks, benefits and alternatives for the proposed anesthesia with the patient or authorized representative who has indicated his/her understanding and acceptance.   Dental Advisory Given  Plan Discussed with: CRNA  Anesthesia Plan Comments:         Anesthesia Quick Evaluation

## 2017-06-29 NOTE — Op Note (Signed)
Patient Name: Mary Curry Date of Procedure: 06/29/2017  Preoperative Diagnosis: 1) 26 y.o. with incomplete abortion  Postoperative Diagnosis: 1) 26 y.o. with with incomplete abortion  Operation Performed: Hysteroscopy, dilation and curettage  Indication: 26 year old with missed abortion, downtrending HCG 06/06/17 21,65138mIU/mL, 1,16880mIU/mL 06/16/17, then 121 on 06/28/2017. Rh status AB positive.  Anesthesia: General  Primary Surgeon: Vena AustriaAndreas Wendy Hoback, MD  Assistant: none  Preoperative Antibiotics: none  Estimated Blood Loss: 10 mL  IV Fluids: 700mL  Urine Output:: ~3320mL straight cath  Drains or Tubes: none  Implants: none  Specimens Removed: endometrial curettings/products of conception  Complications: none  Intraoperative Findings:  Normal cervix, uterus, and minimal tissue of D&C with gritty texture noted throughout the uterus consistent with ultrasound findings of thin endometrial stripe  Patient Condition: stable  Procedure in Detail:  Patient was taken to the operating room were she was administered general endotracheal anesthesia.  She was positioned in the dorsal lithotomy position utilizing Allen stirups, prepped and draped in the usual sterile fashion.  Uterus was noted to be non-enalrged in size, anteverted.   Endometrial stripe on bedside ultrasound 1.3cm.  No intrauterine gestational sac was identified.  Prior to proceeding with the case a time out was performed.  Attention was turned to the patient's pelvis.  A red rubber catheter was used to empty the patient's bladder.  An operative speculum was placed to allow visualization of the cervix.  The anterior lip of the cervix was grasped with a single tooth tenaculum and the cervix was sequentially dilated using pratt dilators.  The uterus sounded to 10cm.  Sharp curettage was performed and the resulting specimen collected and sent to pathology.    The single tooth tenaculum was removed from the cervix.  The  tenaculum sites and cervix were noted to be  Hemostatic before removing the operative speculum.  Sponge needle and instrument counts were corrects times two.  The patient tolerated the procedure well and was taken to the recovery room in stable condition.

## 2017-06-29 NOTE — Anesthesia Post-op Follow-up Note (Signed)
Anesthesia QCDR form completed.        

## 2017-06-29 NOTE — Discharge Instructions (Signed)

## 2017-06-29 NOTE — Anesthesia Postprocedure Evaluation (Signed)
Anesthesia Post Note  Patient: Mary Curry  Procedure(s) Performed: DILATATION AND CURETTAGE (N/A )  Patient location during evaluation: PACU Anesthesia Type: General Level of consciousness: awake and alert Pain management: pain level controlled Vital Signs Assessment: post-procedure vital signs reviewed and stable Respiratory status: spontaneous breathing, nonlabored ventilation, respiratory function stable and patient connected to nasal cannula oxygen Cardiovascular status: blood pressure returned to baseline and stable Postop Assessment: no apparent nausea or vomiting Anesthetic complications: no     Last Vitals:  Vitals:   06/29/17 1651 06/29/17 1708  BP: 118/75 120/85  Pulse: 67 60  Resp: 12 16  Temp: 36.9 C 36.8 C  SpO2: 100% 100%    Last Pain:  Vitals:   06/29/17 1708  TempSrc: Temporal  PainSc: 2                  Lenard SimmerAndrew Tommy Goostree

## 2017-06-29 NOTE — Transfer of Care (Signed)
Immediate Anesthesia Transfer of Care Note  Patient: Mary Curry  Procedure(s) Performed: DILATATION AND CURETTAGE (N/A )  Patient Location: PACU  Anesthesia Type:General  Level of Consciousness: awake  Airway & Oxygen Therapy: Patient Spontanous Breathing and Patient connected to face mask oxygen  Post-op Assessment: Report given to RN and Post -op Vital signs reviewed and stable  Post vital signs: Reviewed and stable  Last Vitals:  Vitals:   06/29/17 1138 06/29/17 1553  BP: 111/77 130/89  Pulse: 62 91  Resp: 16 20  Temp: (!) 35.7 C (!) 36.4 C  SpO2: 97% 100%    Last Pain:  Vitals:   06/29/17 1138  TempSrc: Tympanic         Complications: No apparent anesthesia complications

## 2017-06-29 NOTE — Anesthesia Procedure Notes (Signed)
Procedure Name: LMA Insertion Date/Time: 06/29/2017 3:18 PM Performed by: Ginger CarneMichelet, Alexianna Nachreiner, CRNA Pre-anesthesia Checklist: Patient identified, Emergency Drugs available, Suction available, Patient being monitored and Timeout performed Patient Re-evaluated:Patient Re-evaluated prior to induction Oxygen Delivery Method: Circle system utilized Preoxygenation: Pre-oxygenation with 100% oxygen Induction Type: IV induction LMA: LMA inserted LMA Size: 3.5 Number of attempts: 1 Placement Confirmation: positive ETCO2 and breath sounds checked- equal and bilateral Tube secured with: Tape Dental Injury: Teeth and Oropharynx as per pre-operative assessment

## 2017-06-30 ENCOUNTER — Encounter: Payer: Self-pay | Admitting: Obstetrics and Gynecology

## 2017-06-30 ENCOUNTER — Telehealth: Payer: Self-pay

## 2017-06-30 LAB — NUSWAB VAGINITIS PLUS (VG+)
CANDIDA GLABRATA, NAA: NEGATIVE
Candida albicans, NAA: NEGATIVE
Chlamydia trachomatis, NAA: NEGATIVE
NEISSERIA GONORRHOEAE, NAA: NEGATIVE
Trich vag by NAA: NEGATIVE

## 2017-06-30 NOTE — Telephone Encounter (Signed)
Pt had SDS procedure w/[facility] yesterday. She was unconscious when she left the hospital. She needs a note for work. [id number]#[phone].

## 2017-06-30 NOTE — Telephone Encounter (Signed)
Spoke w/pt. Verified details needed in note. Note created, printed & left @ front desk for p/u. Pt aware.

## 2017-07-04 ENCOUNTER — Encounter: Payer: Self-pay | Admitting: Obstetrics and Gynecology

## 2017-07-13 LAB — SURGICAL PATHOLOGY

## 2017-08-14 ENCOUNTER — Ambulatory Visit (INDEPENDENT_AMBULATORY_CARE_PROVIDER_SITE_OTHER): Payer: BLUE CROSS/BLUE SHIELD | Admitting: Obstetrics and Gynecology

## 2017-08-14 ENCOUNTER — Encounter: Payer: Self-pay | Admitting: Obstetrics and Gynecology

## 2017-08-14 VITALS — BP 98/52 | HR 70 | Ht 64.0 in | Wt 161.0 lb

## 2017-08-14 DIAGNOSIS — Z09 Encounter for follow-up examination after completed treatment for conditions other than malignant neoplasm: Secondary | ICD-10-CM

## 2017-08-14 NOTE — Progress Notes (Signed)
      Postoperative Follow-up Patient presents post op from suction D&C 6weeks ago for incomplete abortion.  Subjective: Patient reports marked improvement in her preop symptoms. Eating a regular diet without difficulty. The patient is not having any pain.  Activity: normal activities of daily living. Has had normal menstrual cycle since procedure already.  Objective: Vitals:   08/14/17 1035  BP: (!) 98/52  Pulse: 70   Gen: NAD HEENT: normocephalic, anicteric Pulmonary: no increased work of breathing Abdomen: soft, non-tender, non-distended GU: normal external female genitalia.  No CMT, uterus non-enlarged, no adnexal masses Ext: no edema  Assessment: 27 y.o. s/p suction D&C stable  Plan: Patient has done well after surgery with no apparent complications.  I have discussed the post-operative course to date, and the expected progress moving forward.  The patient understands what complications to be concerned about.  I will see the patient in routine follow up, or sooner if needed.    Activity plan: No restriction.  Discussed follow up with next pregnancy, discussed contraceptive options if not interested in pregnancy in the near future.  Patient currently undecided.  Vena Austriandreas Jaleigh Mccroskey 08/14/2017, 11:04 AM

## 2017-08-22 ENCOUNTER — Emergency Department (HOSPITAL_COMMUNITY)
Admission: EM | Admit: 2017-08-22 | Discharge: 2017-08-22 | Disposition: A | Discharge status: Home / Self Care | Payer: Medicaid Other | Attending: Emergency Medicine | Admitting: Emergency Medicine

## 2017-08-22 ENCOUNTER — Other Ambulatory Visit: Payer: Self-pay

## 2017-08-22 DIAGNOSIS — R300 Dysuria: Secondary | ICD-10-CM | POA: Diagnosis not present

## 2017-08-22 DIAGNOSIS — N12 Tubulo-interstitial nephritis, not specified as acute or chronic: Secondary | ICD-10-CM | POA: Diagnosis not present

## 2017-08-22 DIAGNOSIS — R1032 Left lower quadrant pain: Secondary | ICD-10-CM | POA: Diagnosis present

## 2017-08-22 LAB — URINALYSIS, ROUTINE W REFLEX MICROSCOPIC
Bilirubin Urine: NEGATIVE
GLUCOSE, UA: NEGATIVE mg/dL
Ketones, ur: NEGATIVE mg/dL
NITRITE: POSITIVE — AB
Protein, ur: 30 mg/dL — AB
Specific Gravity, Urine: 1.012 (ref 1.005–1.030)
pH: 5 (ref 5.0–8.0)

## 2017-08-22 LAB — CBC
HEMATOCRIT: 34.7 % — AB (ref 36.0–46.0)
Hemoglobin: 12.4 g/dL (ref 12.0–15.0)
MCH: 27.1 pg (ref 26.0–34.0)
MCHC: 35.7 g/dL (ref 30.0–36.0)
MCV: 75.9 fL — AB (ref 78.0–100.0)
PLATELETS: 237 10*3/uL (ref 150–400)
RBC: 4.57 MIL/uL (ref 3.87–5.11)
RDW: 13.7 % (ref 11.5–15.5)
WBC: 16.7 10*3/uL — ABNORMAL HIGH (ref 4.0–10.5)

## 2017-08-22 LAB — BASIC METABOLIC PANEL
Anion gap: 10 (ref 5–15)
BUN: 7 mg/dL (ref 6–20)
CHLORIDE: 104 mmol/L (ref 101–111)
CO2: 22 mmol/L (ref 22–32)
Calcium: 9.5 mg/dL (ref 8.9–10.3)
Creatinine, Ser: 0.68 mg/dL (ref 0.44–1.00)
GFR calc Af Amer: >60 mL/min (ref >60)
GLUCOSE: 101 mg/dL — AB (ref 65–99)
POTASSIUM: 3.6 mmol/L (ref 3.5–5.1)
Sodium: 136 mmol/L (ref 135–145)

## 2017-08-22 LAB — HCG, QUANTITATIVE, PREGNANCY: hCG, Beta Chain, Quant, S: 3 m[IU]/mL (ref <5)

## 2017-08-22 LAB — I-STAT BETA HCG BLOOD, ED (MC, WL, AP ONLY): I-stat hCG, quantitative: 13.2 m[IU]/mL — ABNORMAL HIGH (ref <5)

## 2017-08-22 MED ORDER — CIPROFLOXACIN HCL 500 MG PO TABS: 500.0000 mg | ORAL_TABLET | Freq: Two times a day (BID) | ORAL | 0 refills | Status: DC

## 2017-08-22 MED ORDER — OXYCODONE-ACETAMINOPHEN 5-325 MG PO TABS
1.0000 | ORAL_TABLET | Freq: Once | ORAL | Status: AC
  Administered 2017-08-22: 1 via ORAL
  Filled 2017-08-22: qty 1

## 2017-08-22 MED ORDER — DEXTROSE 5 % IV SOLN
1.0000 g | Freq: Once | INTRAVENOUS | Status: AC
  Administered 2017-08-22: 1 g via INTRAVENOUS
  Filled 2017-08-22: qty 10

## 2017-08-22 NOTE — Discharge Instructions (Signed)
Take antibiotics as directed.  If you develop fevers, worsening pain, or any new or worsening symptoms you should be reevaluated.

## 2017-08-22 NOTE — ED Provider Notes (Signed)
[facility] Provider Note   CSN: [id number] Arrival date & time: 08/22/17  0117     History   Chief Complaint Chief Complaint  Patient presents with  . Flank Pain    HPI Mary Curry is a 27 y.o. female.  HPI  This is a 27 year old female who presents with 2-3-day history of worsening left sided flank pain and dysuria.  Onset of symptoms 2-3 days ago.  Reports chills without documented fevers.  Last menstrual period was January 3.  She does report recent miscarriage in November for which she had a D&C.  Patient denies any hematuria but she does report dysuria and frequency.  Left-sided flank pain is sharp and nonradiating.  Initial pain 10 out of 10.  Improved to 6 out of 10 with pain medication.  Denies any vaginal discharge.  Past Medical History:  Diagnosis Date  . Medical history non-contributory     Patient Active Problem List   Diagnosis Date Noted  . Incomplete abortion 06/27/2017    Past Surgical History:  Procedure Laterality Date  . DILATION AND CURETTAGE OF UTERUS N/A 06/29/2017   Procedure: DILATATION AND CURETTAGE;  Surgeon: [name], Andreas, MD;  Location: [facility] ORS;  Service: Gynecology;  Laterality: N/A;  . NO PAST SURGERIES      OB History    Gravida Para Term Preterm AB Living   1 0     1 0   SAB TAB Ectopic Multiple Live Births   1               Home Medications    Prior to Admission medications   Medication Sig Start Date End Date Taking? Authorizing Provider  ciprofloxacin (CIPRO) 500 MG tablet Take 1 tablet (500 mg total) by mouth every 12 (twelve) hours. 08/22/17   Kimetha Trulson, Mayer Maskerourtney F, MD    Family History No family history on file.  Social History Social History   Tobacco Use  . Smoking status: Never Smoker  . Smokeless tobacco: Never Used  Substance Use Topics  . Alcohol use: No  . Drug use: No     Allergies   Patient has no known allergies.   Review of Systems Review of Systems    Constitutional: Positive for chills. Negative for fever.  Respiratory: Negative for shortness of breath.   Gastrointestinal: Positive for nausea. Negative for vomiting.  Genitourinary: Positive for dysuria, flank pain and urgency.  All other systems reviewed and are negative.    Physical Exam Updated Vital Signs BP 103/74   Pulse 80   Temp 98 F (36.7 C) (Oral)   Resp 16   Ht 5\' 4"  (1.626 m)   Wt 68.5 kg (151 lb 2 oz)   LMP 07/29/2017 (Exact Date)   SpO2 100%   BMI 25.94 kg/m   Physical Exam  Constitutional: She is oriented to person, place, and time. She appears well-developed and well-nourished.  HENT:  Head: Normocephalic and atraumatic.  Cardiovascular: Normal rate, regular rhythm and normal heart sounds.  Pulmonary/Chest: Effort normal. No respiratory distress.  Abdominal: Soft. Bowel sounds are normal. There is no tenderness.  Genitourinary:  Genitourinary Comments: Left-sided CVA tenderness  Neurological: She is alert and oriented to person, place, and time.  Skin: Skin is warm and dry.  Psychiatric: She has a normal mood and affect.  Nursing note and vitals reviewed.    ED Treatments / Results  Labs (all labs ordered are listed, but only abnormal results are displayed) Labs Reviewed  URINALYSIS, ROUTINE W REFLEX MICROSCOPIC - Abnormal; Notable for the following components:      Result Value   APPearance HAZY (*)    Hgb urine dipstick LARGE (*)    Protein, ur 30 (*)    Nitrite POSITIVE (*)    Leukocytes, UA LARGE (*)    Bacteria, UA RARE (*)    Squamous Epithelial / LPF 0-5 (*)    All other components within normal limits  CBC - Abnormal; Notable for the following components:   WBC 16.7 (*)    HCT 34.7 (*)    MCV 75.9 (*)    All other components within normal limits  BASIC METABOLIC PANEL - Abnormal; Notable for the following components:   Glucose, Bld 101 (*)    All other components within normal limits  I-STAT BETA HCG BLOOD, ED ([facility], [facility], [facility] ONLY)  - Abnormal; Notable for the following components:   I-stat hCG, quantitative 13.2 (*)    All other components within normal limits  URINE CULTURE  HCG, QUANTITATIVE, PREGNANCY    EKG  EKG Interpretation None       Radiology No results found.  Procedures Procedures (including critical care time)  Medications Ordered in ED Medications  cefTRIAXone (ROCEPHIN) 1 g in dextrose 5 % 50 mL IVPB (0 g Intravenous Stopped 08/22/17 0510)  oxyCODONE-acetaminophen (PERCOCET/ROXICET) 5-325 MG per tablet 1 tablet (1 tablet Oral Given 08/22/17 0441)     Initial Impression / Assessment and Plan / ED Course  I have reviewed the triage vital signs and the nursing notes.  Pertinent labs & imaging results that were available during my care of the patient were reviewed by me and considered in my medical decision making (see chart for details).     Presents with left flank pain.  Urinalysis is indicative of infection.  Patient does have a mild leukocytosis to 16.  No renal dysfunction.  Patient was given IV Rocephin and pain medication.  She does not appear septic.  She has improved after pain medication.  Will discharge home with antibiotics for acute pyelonephritis.  No acute complications.  Patient was given return precautions.  After history, exam, and medical workup I feel the patient has been appropriately medically screened and is safe for discharge home. Pertinent diagnoses were discussed with the patient. Patient was given return precautions.   Final Clinical Impressions(s) / ED Diagnoses   Final diagnoses:  Pyelonephritis    ED Discharge Orders        Ordered    ciprofloxacin (CIPRO) 500 MG tablet  Every 12 hours     08/22/17 0633       Shon Baton, MD 08/22/17 [phone]

## 2017-08-22 NOTE — ED Triage Notes (Signed)
Pt reports L flank pain since yesterday, increasing in severity today. States worse with breathing.

## 2017-08-23 ENCOUNTER — Emergency Department (HOSPITAL_COMMUNITY)
Admission: EM | Admit: 2017-08-23 | Discharge: 2017-08-24 | Disposition: A | Discharge status: Home / Self Care | Payer: Medicaid Other | Attending: Emergency Medicine | Admitting: Emergency Medicine

## 2017-08-23 ENCOUNTER — Encounter (HOSPITAL_COMMUNITY): Payer: Self-pay | Admitting: Family Medicine

## 2017-08-23 DIAGNOSIS — Z5321 Procedure and treatment not carried out due to patient leaving prior to being seen by health care provider: Secondary | ICD-10-CM | POA: Insufficient documentation

## 2017-08-23 DIAGNOSIS — R109 Unspecified abdominal pain: Secondary | ICD-10-CM | POA: Diagnosis not present

## 2017-08-23 LAB — COMPREHENSIVE METABOLIC PANEL
ALBUMIN: 3.8 g/dL (ref 3.5–5.0)
ALT: 27 U/L (ref 14–54)
ANION GAP: 9 (ref 5–15)
AST: 25 U/L (ref 15–41)
Alkaline Phosphatase: 81 U/L (ref 38–126)
BILIRUBIN TOTAL: 0.8 mg/dL (ref 0.3–1.2)
BUN: 9 mg/dL (ref 6–20)
CO2: 22 mmol/L (ref 22–32)
Calcium: 8.9 mg/dL (ref 8.9–10.3)
Chloride: 100 mmol/L — ABNORMAL LOW (ref 101–111)
Creatinine, Ser: 0.81 mg/dL (ref 0.44–1.00)
GFR calc Af Amer: >60 mL/min (ref >60)
GFR calc non Af Amer: >60 mL/min (ref >60)
GLUCOSE: 89 mg/dL (ref 65–99)
POTASSIUM: 3.5 mmol/L (ref 3.5–5.1)
SODIUM: 131 mmol/L — AB (ref 135–145)
TOTAL PROTEIN: 7.9 g/dL (ref 6.5–8.1)

## 2017-08-23 LAB — CBC
HCT: 33.6 % — ABNORMAL LOW (ref 36.0–46.0)
HEMOGLOBIN: 12.3 g/dL (ref 12.0–15.0)
MCH: 27.5 pg (ref 26.0–34.0)
MCHC: 36.6 g/dL — AB (ref 30.0–36.0)
MCV: 75 fL — ABNORMAL LOW (ref 78.0–100.0)
Platelets: 209 10*3/uL (ref 150–400)
RBC: 4.48 MIL/uL (ref 3.87–5.11)
RDW: 13.9 % (ref 11.5–15.5)
WBC: 10.6 10*3/uL — ABNORMAL HIGH (ref 4.0–10.5)

## 2017-08-23 LAB — LIPASE, BLOOD: Lipase: 28 U/L (ref 11–51)

## 2017-08-23 LAB — I-STAT BETA HCG BLOOD, ED (MC, WL, AP ONLY): HCG, QUANTITATIVE: 11.4 m[IU]/mL — AB (ref <5)

## 2017-08-23 MED ORDER — ACETAMINOPHEN 325 MG PO TABS
650.0000 mg | ORAL_TABLET | Freq: Once | ORAL | Status: AC | PRN
  Administered 2017-08-23: 650 mg via ORAL
  Filled 2017-08-23: qty 2

## 2017-08-23 NOTE — ED Triage Notes (Signed)
Patient is complaining of continuous left flank pain, chills, and body aches. [facility] and diagnosed with Pyelonephritis. Given Ciprofloxacin prescription which she reports she has been taken.

## 2017-08-24 NOTE — ED Notes (Signed)
Writer called 2X for room assignment, no response 

## 2017-08-24 NOTE — ED Notes (Signed)
Pt called x3. Last call, no response from lobby

## 2017-08-25 ENCOUNTER — Telehealth: Payer: Self-pay | Admitting: Obstetrics and Gynecology

## 2017-08-25 NOTE — Telephone Encounter (Signed)
Sound non-gyn? If she is having fevers or flank pain I'd be worried about a kidney infection so urgent care or ER would be my recommendation

## 2017-08-25 NOTE — ED Notes (Signed)
08/25/2017,  Follow-up call completed.

## 2017-08-25 NOTE — Telephone Encounter (Signed)
-----   Message from [place], Generic sent at 08/24/2017 12:50 PM EST -----    Appointment Request From: Jerene PitchFanta Lavin    With Provider: Vena AustriaAndreas Staebler, MD [[facility]]    Preferred Date Range: 08/28/2017 - 08/29/2017    Preferred Times: Monday Morning, Tuesday Morning, Wednesday Morning    Reason for visit: Office Visit    Comments:  Im having a flank pain, chill , headache , my bone hurts    . Please advise schedule appt. Does patient need to follow up with urgent care. Patient last seen 08/14/17 with [facility] for post op appt

## 2017-10-23 ENCOUNTER — Telehealth: Payer: Self-pay | Admitting: Obstetrics & Gynecology

## 2017-10-23 NOTE — Telephone Encounter (Signed)
Patient is calling in due to positive Pregnancy test and was told to call and let the Doctor know. Patient has hx of miscarriage please advise .

## 2017-10-24 ENCOUNTER — Other Ambulatory Visit: Payer: Self-pay | Admitting: Obstetrics and Gynecology

## 2017-10-24 DIAGNOSIS — O3680X0 Pregnancy with inconclusive fetal viability, not applicable or unspecified: Secondary | ICD-10-CM

## 2017-10-24 DIAGNOSIS — Z8759 Personal history of other complications of pregnancy, childbirth and the puerperium: Secondary | ICD-10-CM

## 2017-10-24 DIAGNOSIS — Z3201 Encounter for pregnancy test, result positive: Secondary | ICD-10-CM

## 2017-10-24 NOTE — Telephone Encounter (Signed)
I called and spoke with patient about orders for labs to be schedule for today. [place] and will not be able to be seen for labs until Friday. Patient is schedule 10/27/17 for Labs

## 2017-10-24 NOTE — Telephone Encounter (Signed)
Called and lest voicemail for patient to call back to be schedule

## 2017-10-24 NOTE — Telephone Encounter (Signed)
Sent a message we'll start by making sure the blood pregnancy hormone is rising appropriately.  If it does we'll determine the timing of the ultrasound and first visit based on the levels

## 2017-10-27 ENCOUNTER — Other Ambulatory Visit: Payer: BLUE CROSS/BLUE SHIELD

## 2017-10-27 ENCOUNTER — Other Ambulatory Visit: Payer: Medicaid Other

## 2017-10-27 DIAGNOSIS — Z3201 Encounter for pregnancy test, result positive: Secondary | ICD-10-CM

## 2017-10-27 DIAGNOSIS — O3680X0 Pregnancy with inconclusive fetal viability, not applicable or unspecified: Secondary | ICD-10-CM

## 2017-10-27 DIAGNOSIS — Z8759 Personal history of other complications of pregnancy, childbirth and the puerperium: Secondary | ICD-10-CM

## 2017-10-28 LAB — BETA HCG QUANT (REF LAB): HCG QUANT: 1664 m[IU]/mL

## 2017-10-30 ENCOUNTER — Encounter: Payer: Self-pay | Admitting: Obstetrics and Gynecology

## 2017-10-30 ENCOUNTER — Ambulatory Visit: Payer: Medicaid Other | Admitting: Obstetrics and Gynecology

## 2017-10-30 VITALS — BP 110/72 | HR 97 | Ht 65.0 in | Wt 158.0 lb

## 2017-10-30 DIAGNOSIS — Z8759 Personal history of other complications of pregnancy, childbirth and the puerperium: Secondary | ICD-10-CM | POA: Diagnosis not present

## 2017-10-30 DIAGNOSIS — O283 Abnormal ultrasonic finding on antenatal screening of mother: Secondary | ICD-10-CM | POA: Diagnosis not present

## 2017-10-30 DIAGNOSIS — O3680X0 Pregnancy with inconclusive fetal viability, not applicable or unspecified: Secondary | ICD-10-CM

## 2017-10-30 DIAGNOSIS — Z3201 Encounter for pregnancy test, result positive: Secondary | ICD-10-CM

## 2017-10-30 NOTE — Progress Notes (Signed)
Obstetrics & Gynecology Office Visit   Chief Complaint:  Chief Complaint  Patient presents with  . Early OB    Hot flashes/cold sweats  . Morning Sickness    History of Present Illness: 27 year old G2P0010 presenting in early pregnancy for vasomotor type symptoms.  [name] was 09/21/2017 (exact date). giving an EDD of 06/28/2018 and EGA of [redacted]w[redacted]d.  Currently in process of trending HCG with initial HCG 10/27/2017 1664 mIU/mL, with plan for repeat HCG tomorrow.  The patient has noted hot flashes, cold sweats, and morning sickness.  She has not experienced and cramping or vaginal bleeding.  Her history is notable for prior first trimester miscarriage treated with suction D&C on 06/29/2017.      Review of Systems: Review of Systems  Constitutional: Positive for chills, diaphoresis and malaise/fatigue. Negative for fever.  Gastrointestinal: Positive for nausea and vomiting. Negative for abdominal pain.    Past Medical History:  Past Medical History:  Diagnosis Date  . Medical history non-contributory     Past Surgical History:  Past Surgical History:  Procedure Laterality Date  . DILATION AND CURETTAGE OF UTERUS N/A 06/29/2017   Procedure: DILATATION AND CURETTAGE;  Surgeon: Vena Austria, MD;  Location: [facility] ORS;  Service: Gynecology;  Laterality: N/A;  . NO PAST SURGERIES      Gynecologic History: [name] was 09/21/2017 (exact date).  Obstetric History: G2P0010  Family History:  No family history on file.  Social History:  Social History   Socioeconomic History  . Marital status: Single    Spouse name: Not on file  . Number of children: Not on file  . Years of education: Not on file  . Highest education level: Not on file  Occupational History  . Not on file  Social Needs  . Financial resource strain: Not on file  . Food insecurity:    Worry: Not on file    Inability: Not on file  . Transportation needs:   Medical: Not on file    Non-medical: Not on file  Tobacco Use  . Smoking status: Never Smoker  . Smokeless tobacco: Never Used  Substance and Sexual Activity  . Alcohol use: No  . Drug use: No  . Sexual activity: Yes    Birth control/protection: None  Lifestyle  . Physical activity:    Days per week: Not on file    Minutes per session: Not on file  . Stress: Not on file  Relationships  . Social connections:    Talks on phone: Not on file    Gets together: Not on file    Attends religious service: Not on file    Active member of club or organization: Not on file    Attends meetings of clubs or organizations: Not on file    Relationship status: Not on file  . Intimate partner violence:    Fear of current or ex partner: Not on file    Emotionally abused: Not on file    Physically abused: Not on file    Forced sexual activity: Not on file  Other Topics Concern  . Not on file  Social History Narrative  . Not on file    Allergies:  No Known Allergies  Medications: Prior to Admission medications   Not on File    Physical Exam Vitals:  Vitals:   10/30/17 1052  BP: 110/72  Pulse: 97   [name] was 09/21/2017 (exact date).  General: NAD HEENT: normocephalic, anicteric Pulmonary: No increased work of breathing Extremities: no edema, erythema, or tenderness Neurologic: Grossly intact Psychiatric: mood appropriate, affect full  Female chaperone present for pelvic  portions of the physical exam  Assessment: 27 y.o. G2P0010 in early pregnancy  Plan: Problem List Items Addressed This Visit      Other   Miscarriage within last 12 months - Primary    Other Visit Diagnoses    Positive blood pregnancy test       Pregnancy of unknown anatomic location       Pregnancy with uncertain fetal viability, single or unspecified fetus         - Reassured that symptoms are likely related to early pregnancy - Repeat HCG tomorrow, two doublings  - If  appropriate rise in HCG will schedule dating scan and NOB - instructed to start PNV  Vena AustriaAndreas Kelsen Celona, MD, Merlinda FrederickFACOG [facility], [facility] 10/30/2017, 5:45 PM

## 2017-10-31 ENCOUNTER — Ambulatory Visit (INDEPENDENT_AMBULATORY_CARE_PROVIDER_SITE_OTHER): Payer: Medicaid Other | Admitting: Obstetrics and Gynecology

## 2017-10-31 ENCOUNTER — Encounter: Payer: Self-pay | Admitting: Obstetrics and Gynecology

## 2017-10-31 ENCOUNTER — Other Ambulatory Visit: Payer: Medicaid Other

## 2017-10-31 ENCOUNTER — Telehealth: Payer: Self-pay

## 2017-10-31 ENCOUNTER — Other Ambulatory Visit: Payer: Self-pay | Admitting: Obstetrics and Gynecology

## 2017-10-31 VITALS — BP 100/60 | Ht 65.0 in | Wt 158.0 lb

## 2017-10-31 DIAGNOSIS — O26899 Other specified pregnancy related conditions, unspecified trimester: Secondary | ICD-10-CM | POA: Diagnosis not present

## 2017-10-31 DIAGNOSIS — Z3490 Encounter for supervision of normal pregnancy, unspecified, unspecified trimester: Secondary | ICD-10-CM

## 2017-10-31 DIAGNOSIS — Z8759 Personal history of other complications of pregnancy, childbirth and the puerperium: Secondary | ICD-10-CM | POA: Diagnosis not present

## 2017-10-31 DIAGNOSIS — O3680X0 Pregnancy with inconclusive fetal viability, not applicable or unspecified: Secondary | ICD-10-CM

## 2017-10-31 DIAGNOSIS — Z3201 Encounter for pregnancy test, result positive: Secondary | ICD-10-CM

## 2017-10-31 DIAGNOSIS — R109 Unspecified abdominal pain: Secondary | ICD-10-CM | POA: Diagnosis not present

## 2017-10-31 NOTE — Progress Notes (Signed)
Patient ID: Mary Curry, female   DOB: 02-18-1991, 27 y.o.   MRN: [id number]  Reason for Consult: Abdominal Pain (inside stomach burns)   Referred by No ref. provider found  Subjective:     HPI:  Mary Curry is a 27 y.o. female she presents today with complaints of uterine cramping. She is nervous because she had similar symptoms in her last pregnancy and then two days later she had a miscarriage. She has a beta hcg drawn this morning. Previously her beta level was 1,664 on 10/27/17. She denies vaginal bleeding. She has not taken anything for the abdominal pain or cramping.   OB History  Gravida Para Term Preterm AB Living  2 0     1 0  SAB TAB Ectopic Multiple Live Births  1            # Outcome Date GA Lbr Len/2nd Weight Sex Delivery Anes PTL Lv  2 SAB 06/29/17 [redacted]w[redacted]d         1 Gravida              Past Medical History:  Diagnosis Date  . Medical history non-contributory    History reviewed. No pertinent family history. Past Surgical History:  Procedure Laterality Date  . DILATION AND CURETTAGE OF UTERUS N/A 06/29/2017   Procedure: DILATATION AND CURETTAGE;  Surgeon: Vena Austria, MD;  Location: [facility] ORS;  Service: Gynecology;  Laterality: N/A;  . NO PAST SURGERIES      Short Social History:  Social History   Tobacco Use  . Smoking status: Never Smoker  . Smokeless tobacco: Never Used  Substance Use Topics  . Alcohol use: No    No Known Allergies  No current outpatient medications on file.   No current facility-administered medications for this visit.     Review of Systems  Constitutional: Negative for chills, fatigue, fever and unexpected weight change.  HENT: Negative for trouble swallowing.  Eyes: Negative for loss of vision.  Respiratory: Negative for cough, shortness of breath and wheezing.  Cardiovascular: Negative for chest pain, leg swelling, palpitations and syncope.  GI: Positive for abdominal pain. Negative for blood in stool, diarrhea,  nausea and vomiting.  GU: Negative for difficulty urinating, dysuria, frequency and hematuria.  Musculoskeletal: Negative for back pain, leg pain and joint pain.  Skin: Negative for rash.  Neurological: Negative for dizziness, headaches, light-headedness, numbness and seizures.  Psychiatric: Negative for behavioral problem, confusion, depressed mood and sleep disturbance.        Objective:  Objective   Vitals:   10/31/17 1423  BP: 100/60  Weight: 158 lb (71.7 kg)  Height: 5\' 5"  (1.651 m)   Body mass index is 26.29 kg/m.  Physical Exam  Constitutional: She is oriented to person, place, and time. She appears well-developed and well-nourished.  HENT:  Head: Normocephalic and atraumatic.  Eyes: EOM are normal.  Cardiovascular: Normal rate, regular rhythm and normal heart sounds.  Pulmonary/Chest: Effort normal.  Abdominal: Soft.  Neurological: She is alert and oriented to person, place, and time.  Skin: Skin is warm and dry.  Psychiatric: She has a normal mood and affect. Her behavior is normal. Judgment and thought content normal.  Nursing note and vitals reviewed.  Bedside transvaginal US showed a gestational sac and a yolk sac in the uterus. No adnexal masses.       Assessment/Plan:     27 yo in early pregnancy.  Mary Curry is 5 weeks and 5 days by her last menstrual  period. Will need to follow beta hcg's to determine if levels are rising appropriately. Will add progesterone level, if this is low could consider progesterone supplementation.  Discussed that if she were to miscarry a evaluation of recurrent miscarriages can be done. Also discussed the Anora test. Encouraged patient to take tylenol and rest. Given the phone number of the hospice grief counselor so that she could discuss her anxiety and depression after her previous miscarriage. Follow up as planned.   Natale Milchhristanna R Neena Beecham MD [facility] 10/31/17 6:08 PM

## 2017-10-31 NOTE — Telephone Encounter (Signed)
Pt calling c/o sharp pain mid abd, trying to prevent another miscarriage, is scared, doesn't know what to do, if normal or needs to be seen.  [phone]  Pt states she is 5-6wks, tried to explain that baby is still down low in pelvis and not up to belly button yet.  Pt states she is scared and doesn't know what to do.  Appt at 2:30 c CS

## 2017-11-01 ENCOUNTER — Encounter (INDEPENDENT_AMBULATORY_CARE_PROVIDER_SITE_OTHER): Payer: Self-pay

## 2017-11-01 ENCOUNTER — Other Ambulatory Visit: Payer: Self-pay | Admitting: Obstetrics and Gynecology

## 2017-11-01 DIAGNOSIS — O3680X0 Pregnancy with inconclusive fetal viability, not applicable or unspecified: Secondary | ICD-10-CM

## 2017-11-01 LAB — BETA HCG QUANT (REF LAB): HCG QUANT: 5062 m[IU]/mL

## 2017-11-01 NOTE — Progress Notes (Signed)
HCG rise less than expected 1664 to 5062 mIU/mL in 96-hrs, bedside US yesterday showing GS with early yolk sac. Will arrange for follow up ultrasound in 11 days from yesterday  "Society of Radiologyst in Ultrasound Guidelines for Transvaginal Ultrasonographic Diagnosis of Early Pregnancy Loss" and adopted in ACOG Practice Bulletin Number 150, May 2015 (reaffirmed 2017) "Early Pregnancy Loss"  - CRL of 7mm or greater and absence of fetal heartbeat  - Mean sac diameter of 25mm or greater and no embryo  - Absence of embryo with a discernable heartbeat 2 week after initial ultrasound showing a gestational sac without a yolk sac  - Absence of embryo with a discernable heartbeat 11 days after initial ultrasound showing a gestational sac with  yolk sac present   Progesterone level pending.  Progesterone supplementation had been discussed with the patient at her visit with Dr. Jerene PitchSchuman yesterday.  I'm not opposed to progesterone supplementation but would have the patient note that while no side-effects no benefits have been shown in regards to preventing first trimester loss.  At present there is no evidence to support the use of vaginal progesterone in women with a history of unexplained recurrent first trimester miscarriage in an attempt to minimize risk of miscarriage in a subsequent pregnancy.  Its use did not show a clinically significant increase in live birth rate.  However, there were also no identified adverse events to its use.  T  "A Randomized Trial of Progesterone in Women with Recurrent Miscarriages" NEJM 373;22 June 26, 2014   Vena AustriaAndreas Taeshawn Helfman, MD, Merlinda FrederickFACOG [facility], [facility] 11/01/2017, 8:22 AM

## 2017-11-02 ENCOUNTER — Other Ambulatory Visit: Payer: Medicaid Other

## 2017-11-03 ENCOUNTER — Encounter: Payer: Self-pay | Admitting: Obstetrics and Gynecology

## 2017-11-03 LAB — PROGESTERONE: Progesterone: 9.9 ng/mL

## 2017-11-03 NOTE — Telephone Encounter (Signed)
Your patient 

## 2017-11-08 ENCOUNTER — Encounter: Payer: Self-pay | Admitting: Obstetrics and Gynecology

## 2017-11-08 ENCOUNTER — Ambulatory Visit (INDEPENDENT_AMBULATORY_CARE_PROVIDER_SITE_OTHER): Payer: Medicaid Other | Admitting: Obstetrics and Gynecology

## 2017-11-08 ENCOUNTER — Ambulatory Visit (INDEPENDENT_AMBULATORY_CARE_PROVIDER_SITE_OTHER): Payer: Medicaid Other

## 2017-11-08 VITALS — BP 118/74 | Ht 65.0 in | Wt 156.0 lb

## 2017-11-08 DIAGNOSIS — Z113 Encounter for screening for infections with a predominantly sexual mode of transmission: Secondary | ICD-10-CM | POA: Diagnosis not present

## 2017-11-08 DIAGNOSIS — Z3491 Encounter for supervision of normal pregnancy, unspecified, first trimester: Secondary | ICD-10-CM | POA: Diagnosis not present

## 2017-11-08 DIAGNOSIS — Z3490 Encounter for supervision of normal pregnancy, unspecified, unspecified trimester: Secondary | ICD-10-CM | POA: Insufficient documentation

## 2017-11-08 DIAGNOSIS — Z3A01 Less than 8 weeks gestation of pregnancy: Secondary | ICD-10-CM | POA: Diagnosis not present

## 2017-11-08 DIAGNOSIS — Z124 Encounter for screening for malignant neoplasm of cervix: Secondary | ICD-10-CM | POA: Diagnosis not present

## 2017-11-08 DIAGNOSIS — O283 Abnormal ultrasonic finding on antenatal screening of mother: Secondary | ICD-10-CM

## 2017-11-08 DIAGNOSIS — O3680X0 Pregnancy with inconclusive fetal viability, not applicable or unspecified: Secondary | ICD-10-CM

## 2017-11-08 MED ORDER — PRENATE MINI 18-0.6-0.4-350 MG PO CAPS: 1.0000 | ORAL_CAPSULE | Freq: Every day | ORAL | 8 refills | Status: DC

## 2017-11-08 NOTE — Progress Notes (Signed)
New Obstetric Patient H&P   Chief Complaint: "Desires prenatal care"   History of Present Illness: Patient is a 27 y.o. [id number] Not Hispanic or Latino female, LMP 09/21/2017 presents with amenorrhea and positive home pregnancy test. Based on her  LMP, her EDD is Estimated Date of Delivery: 06/28/18 and her EGA is 7950w6d. Cycles are irregular.  Her last pap smear was 1 years ago and was no abnormalities.    She had a urine pregnancy test which was positive 2 week(s)  ago. Her last menstrual period was normal and lasted for  3 day(s). Since her LMP she claims she has experienced no issues. She denies vaginal bleeding. Her past medical history is noncontributory.  She had a pregnancy about two years ago that required some sort of procedure. She is unsure whether the pregnancy was ectopic. However, she had a procedure but not abdominal incisions. G1 was a miscarriage last year that required a D&C.     Since her LMP, she admits to the use of tobacco products  no She claims she has gained no noticeable weight since the start of her pregnancy.  There are cats in the home in the home  no  She admits close contact with children on a regular basis  yes  She has had chicken pox in the past no She has had Tuberculosis exposures, symptoms, or previously tested positive for TB   no Current or past history of domestic violence. no  Genetic Screening/Teratology Counseling: (Includes patient, baby's father, or anyone in either family with:)   1. [facility]'s age [facility]/= 7035 [facility]  no 2. Thalassemia ([place], [place], Mediterranean, or Asian background): MCV<80  no 3. Neural tube defect (meningomyelocele, spina bifida, anencephaly)  no 4. Congenital heart defect  no  5. Down syndrome  no 6. Tay-Sachs (Jewish, [place])  no 7. Canavan's Disease  no 8. Sickle cell disease or trait (African)  no  9. Hemophilia or other blood disorders  no  10. Muscular dystrophy  no  11. Cystic fibrosis  no  12. Huntington's  Chorea  no  13. Mental retardation/autism  no 14. Other inherited genetic or chromosomal disorder  no 15. Maternal metabolic disorder (DM, PKU, etc)  no 16. Patient or FOB with a child with a birth defect not listed above no  16a. Patient or FOB with a birth defect themselves no 17. Recurrent pregnancy loss, or stillbirth  yes  18. Any medications since LMP other than prenatal vitamins (include vitamins, supplements, OTC meds, drugs, alcohol)  no 19. Any other genetic/environmental exposure to discuss  no  Infection History:   1. Lives with someone with TB or TB exposed  no  2. Patient or partner has history of genital herpes  no 3. Rash or viral illness since LMP  no 4. History of STI (GC, CT, HPV, syphilis, HIV)  Chlamydia 1 year ago and was treated 5. History of recent travel :  no  Other pertinent information:  no   Review of Systems:10 point review of systems negative unless otherwise noted in HPI  Past Medical History:  Diagnosis Date  . Medical history non-contributory     Past Surgical History:  Procedure Laterality Date  . DILATION AND CURETTAGE OF UTERUS N/A 06/29/2017   Procedure: DILATATION AND CURETTAGE;  Surgeon: [name], Andreas, MD;  Location: [facility] ORS;  Service: Gynecology;  Laterality: N/A;  . NO PAST SURGERIES      Gynecologic History: [name] was 09/21/2017 (exact date).  Obstetric History: G3P0020  Family History: denies history of gynecologic cancer  Social History   Socioeconomic History  . Marital status: Single    Spouse name: Not on file  . Number of children: Not on file  . Years of education: Not on file  . Highest education level: Not on file  Occupational History  . Not on file  Social Needs  . Financial resource strain: Not on file  . Food insecurity:    Worry: Not on file    Inability: Not on file  . Transportation needs:    Medical: Not on file    Non-medical: Not on file  Tobacco Use  . Smoking  status: Never Smoker  . Smokeless tobacco: Never Used  Substance and Sexual Activity  . Alcohol use: No  . Drug use: No  . Sexual activity: Yes    Birth control/protection: None  Lifestyle  . Physical activity:    Days per week: Not on file    Minutes per session: Not on file  . Stress: Not on file  Relationships  . Social connections:    Talks on phone: Not on file    Gets together: Not on file    Attends religious service: Not on file    Active member of club or organization: Not on file    Attends meetings of clubs or organizations: Not on file    Relationship status: Not on file  . Intimate partner violence:    Fear of current or ex partner: Not on file    Emotionally abused: Not on file    Physically abused: Not on file    Forced sexual activity: Not on file  Other Topics Concern  . Not on file  Social History Narrative  . Not on file   Allergies: No Known Allergies  Prior to Admission medications   PNV   Physical Exam BP 118/74   Ht 5\' 5"  (1.651 m)   Wt 156 lb (70.8 kg)   LMP 09/21/2017 (Exact Date)   BMI 25.96 kg/m   Physical Exam  Constitutional: She is oriented to person, place, and time. She appears well-developed and well-nourished. No distress.  HENT:  Head: Normocephalic and atraumatic.  Eyes: Conjunctivae are normal. No scleral icterus.  Neck: Normal range of motion. Neck supple. No thyromegaly present.  Cardiovascular: Normal rate, regular rhythm and normal heart sounds. Exam reveals no gallop and no friction rub.  No murmur heard. Pulmonary/Chest: Effort normal and breath sounds normal. She has no wheezes.  Abdominal: Soft. She exhibits no distension and no mass. There is no tenderness. There is no rebound and no guarding. No hernia. Hernia confirmed negative in the right inguinal area and confirmed negative in the left inguinal area.  Genitourinary: Pelvic exam was performed with patient supine. There is no rash, tenderness or lesion on the right  labia. There is no rash, tenderness or lesion on the left labia.  Musculoskeletal: Normal range of motion.  Lymphadenopathy:    She has no cervical adenopathy.       Right: No inguinal adenopathy present.       Left: No inguinal adenopathy present.  Neurological: She is alert and oriented to person, place, and time.  Skin: Skin is warm and dry. No rash noted.  Psychiatric: She has a normal mood and affect. Her behavior is normal. Judgment normal.    Female Chaperone present during breast and/or pelvic exam.  Assessment: 27 y.o. G3P0020 at [redacted]w[redacted]d presenting to initiate prenatal care  Plan: 1) Avoid alcoholic beverages. 2) Patient encouraged not to smoke.  3) Discontinue the use of all non-medicinal drugs and chemicals.  4) Take prenatal vitamins daily.  5) Nutrition, food safety (fish, cheese advisories, and high nitrite foods) and exercise discussed. 6) Hospital and practice style discussed with cross coverage system.  7) Genetic Screening, such as with 1st Trimester Screening, cell free fetal DNA, AFP testing, and Ultrasound, as well as with amniocentesis and CVS as appropriate, is discussed with patient. At the conclusion of today's visit patient undecided genetic testing 8) Patient is asked about travel to areas at risk for the [place] virus, and counseled to avoid travel and exposure to mosquitoes or sexual partners who may have themselves been exposed to the virus. Testing is discussed, and will be ordered as appropriate.   Thomasene Mohair, MD 11/08/2017 2:15 PM

## 2017-11-10 LAB — URINE CULTURE: Organism ID, Bacteria: NO GROWTH

## 2017-11-10 LAB — URINE DRUG PANEL 7
AMPHETAMINES, URINE: NEGATIVE ng/mL
BARBITURATE QUANT UR: NEGATIVE ng/mL
BENZODIAZEPINE QUANT UR: NEGATIVE ng/mL
Cannabinoid Quant, Ur: NEGATIVE ng/mL
Cocaine (Metab.): NEGATIVE ng/mL
OPIATE QUANT UR: NEGATIVE ng/mL
PCP Quant, Ur: NEGATIVE ng/mL

## 2017-11-10 LAB — IGP,CTNGTV,RFX APTIMA HPV ASCU
Chlamydia, Nuc. Acid Amp: NEGATIVE
Gonococcus, Nuc. Acid Amp: NEGATIVE
PAP Smear Comment: 0
TRICH VAG BY NAA: NEGATIVE

## 2017-11-13 ENCOUNTER — Ambulatory Visit (INDEPENDENT_AMBULATORY_CARE_PROVIDER_SITE_OTHER): Payer: Medicaid Other | Admitting: Obstetrics & Gynecology

## 2017-11-13 ENCOUNTER — Ambulatory Visit (INDEPENDENT_AMBULATORY_CARE_PROVIDER_SITE_OTHER): Payer: Medicaid Other

## 2017-11-13 VITALS — BP 100/60 | Wt 156.0 lb

## 2017-11-13 DIAGNOSIS — O3680X0 Pregnancy with inconclusive fetal viability, not applicable or unspecified: Secondary | ICD-10-CM

## 2017-11-13 DIAGNOSIS — Z3A01 Less than 8 weeks gestation of pregnancy: Secondary | ICD-10-CM

## 2017-11-13 DIAGNOSIS — Z3491 Encounter for supervision of normal pregnancy, unspecified, first trimester: Secondary | ICD-10-CM

## 2017-11-13 MED ORDER — DOXYLAMINE-PYRIDOXINE ER 20-20 MG PO TBCR: 1.0000 | EXTENDED_RELEASE_TABLET | Freq: Two times a day (BID) | ORAL | 11 refills | Status: DC | PRN

## 2017-11-13 NOTE — Patient Instructions (Signed)
Doxylamine; Pyridoxine delayed and extended-release tablets What is this medicine? Doxylamine; pyridoxine (dox IL a meen; peer i DOX een) is a combination of an antihistamine and vitamin B6. The drug is used to treat nausea and vomiting associated with pregnancy. This medicine may be used for other purposes; ask your health care provider or pharmacist if you have questions. COMMON BRAND NAME(S): Diclegis, Bonjesta What should I tell my health care provider before I take this medicine? They need to know if you have any of these conditions: -contact lenses -glaucoma -liver disease -lung or breathing disease, like asthma or emphysema -pain or trouble passing urine -prostate trouble -ulcers or other stomach problems -an unusual or allergic reaction to doxylamine or pyridoxine (vitamin B6), other medicines, foods, dyes, or preservatives -breast-feeding How should I use this medicine? Take this medicine by mouth with a glass of water. Do not cut, crush or chew this medicine. Follow the directions on the package or prescription label. Take this medicine on an empty stomach. Take your medicine at regular intervals. Do not take it more often than directed. Talk to your pediatrician regarding the use of this medicine in children. Special care may be needed. Overdosage: If you think you have taken too much of this medicine contact a poison control center or emergency room at once. NOTE: This medicine is only for you. Do not share this medicine with others. What if I miss a dose? If you miss a dose, take it as soon as you can. If it is almost time for your next dose, take only that dose. Do not take double or extra doses. What may interact with this medicine? -alcohol -atropine -antihistamines for allergy, cough and cold -certain medicines for bladder problems like oxybutynin, tolterodine -certain medicines for stomach problems like dicyclomine, hyoscyamine -certain medicines for travel sickness  like scopolamine -certain medicines for Parkinson's disease like benztropine, trihexyphenidyl -ipratropium -MAOIs like Carbex, Eldepryl, Marplan, Nardil, and Parnate This list may not describe all possible interactions. Give your health care provider a list of all the medicines, herbs, non-prescription drugs, or dietary supplements you use. Also tell them if you smoke, drink alcohol, or use illegal drugs. Some items may interact with your medicine. What should I watch for while using this medicine? Tell your doctor or healthcare professional if your symptoms do not start to get better or if they get worse. See your doctor right away if you get a high fever or have problems breathing. Your mouth may get dry. Chewing sugarless gum or sucking hard candy, and drinking plenty of water may help. Contact your doctor if the problem does not go away or is severe. This medicine may cause dry eyes and blurred vision. If you wear contact lenses you may feel some discomfort. Lubricating drops may help. See your eye doctor if the problem does not go away or is severe. You may get drowsy or dizzy. Do not drive, use machinery, or do anything that needs mental alertness until you know how this medicine affects you. Do not stand or sit up quickly, especially if you are an older patient. This reduces the risk of dizzy or fainting spells. What side effects may I notice from receiving this medicine? Side effects that you should report to your doctor or health care professional as soon as possible: -allergic reactions like skin rash, itching or hives, swelling of the face, lips, or tongue -changes in vision -confused, agitated, or nervous -fast, irregular heartbeat -feeling faint or lightheaded, falls -muscle or facial  twitches -seizure -trouble passing urine or change in the amount of urine Side effects that usually do not require medical attention (report to your doctor or health care professional if they continue  or are bothersome): -dry mouth -headache -loss of appetite -stomach upset This list may not describe all possible side effects. Call your doctor for medical advice about side effects. You may report side effects to FDA at 1-800-FDA-1088. Where should I keep my medicine? Keep out of the reach of children. Store at room temperature between 15 and 30 degrees C (59 and 86 degrees F). Keep bottle tightly closed and protect from moisture. Do not remove desiccant canister from bottle. Throw away any unused medicine after the expiration date. NOTE: This sheet is a summary. It may not cover all possible information. If you have questions about this medicine, talk to your doctor, pharmacist, or health care provider.  2018 Elsevier/Gold Standard (2015-08-20 10:18:26)

## 2017-11-13 NOTE — Progress Notes (Signed)
  HPI: Pt has some nausea and some lower abd cramping that is iintermittent.  No bleeding  Ultrasound demonstrates One baby, EDC 06/30/18 c/w LMP, FHT 150s These findings are reassuring to the pregnancy  PMHx: She  has a past medical history of Medical history non-contributory. Also,  has a past surgical history that includes No past surgeries and Dilation and curettage of uterus (N/A, 06/29/2017)., family history is not on file.,  reports that she has never smoked. She has never used smokeless tobacco. She reports that she does not drink alcohol or use drugs.  She has a current medication list which includes the following prescription(s): doxylamine-pyridoxine er and prenate mini. Also, has No Known Allergies.  ROS  Objective: BP 100/60   Wt 156 lb (70.8 kg)   LMP 09/21/2017 (Exact Date)   BMI 25.96 kg/m   Physical examination Constitutional NAD, Conversant  Skin No rashes, lesions or ulceration.   Extremities: Moves all appropriately.  Normal ROM for age. No lymphadenopathy.  Neuro: Grossly intact  Psych: Oriented to PPT.  Normal mood. Normal affect.   Assessment:  [redacted] weeks gestation of pregnancy Encounter for supervision of low-risk pregnancy in first trimester Bonjesta for nausea counseled, Rx  Review of ULTRASOUND.    [facility]    Plan of management to be discussed with patient.  Annamarie MajorPaul Pierre Cumpton, MD, Merlinda FrederickFACOG [facility], [facility] 11/13/2017  11:25 AM

## 2017-11-16 LAB — RPR+RH+ABO+RUB AB+AB SCR+CB...
ANTIBODY SCREEN: NEGATIVE
HEMOGLOBIN: 11.9 g/dL (ref 11.1–15.9)
HIV Screen 4th Generation wRfx: NONREACTIVE
Hematocrit: 35.2 % (ref 34.0–46.6)
Hepatitis B Surface Ag: NEGATIVE
MCH: 26.2 pg — ABNORMAL LOW (ref 26.6–33.0)
MCHC: 33.8 g/dL (ref 31.5–35.7)
MCV: 78 fL — ABNORMAL LOW (ref 79–97)
PLATELETS: 278 10*3/uL (ref 150–379)
RBC: 4.54 x10E6/uL (ref 3.77–5.28)
RDW: 16.6 % — ABNORMAL HIGH (ref 12.3–15.4)
RH TYPE: POSITIVE
RPR Ser Ql: NONREACTIVE
Rubella Antibodies, IGG: 13.7 index (ref >0.99)
Varicella zoster IgG: 723 index (ref >165)
WBC: 8.9 10*3/uL (ref 3.4–10.8)

## 2017-11-16 LAB — HEMOGLOBINOPATHY EVALUATION
HEMOGLOBIN A2 QUANTITATION: 3.5 % — AB (ref 1.8–3.2)
HEMOGLOBIN F QUANTITATION: 0 % (ref 0.0–2.0)
HGB C: 33.4 % — ABNORMAL HIGH
HGB S: 0 %
HGB VARIANT: 0 %
Hgb A: 63.1 % — ABNORMAL LOW (ref 96.4–98.8)

## 2017-11-18 LAB — INHERITEST CORE(CF97,SMA,FRAX)

## 2017-11-20 ENCOUNTER — Encounter: Payer: Self-pay | Admitting: Obstetrics & Gynecology

## 2017-11-20 ENCOUNTER — Encounter: Payer: Self-pay | Admitting: Obstetrics and Gynecology

## 2017-11-22 ENCOUNTER — Encounter: Payer: Self-pay | Admitting: Obstetrics and Gynecology

## 2017-11-23 ENCOUNTER — Encounter: Payer: Self-pay | Admitting: Obstetrics and Gynecology

## 2017-12-11 ENCOUNTER — Ambulatory Visit (INDEPENDENT_AMBULATORY_CARE_PROVIDER_SITE_OTHER): Payer: Medicaid Other | Admitting: Obstetrics and Gynecology

## 2017-12-11 ENCOUNTER — Encounter: Payer: Self-pay | Admitting: Obstetrics and Gynecology

## 2017-12-11 VITALS — BP 90/60 | Wt 154.0 lb

## 2017-12-11 DIAGNOSIS — Z3A11 11 weeks gestation of pregnancy: Secondary | ICD-10-CM

## 2017-12-11 DIAGNOSIS — Z148 Genetic carrier of other disease: Secondary | ICD-10-CM

## 2017-12-11 DIAGNOSIS — Z3491 Encounter for supervision of normal pregnancy, unspecified, first trimester: Secondary | ICD-10-CM

## 2017-12-11 DIAGNOSIS — D582 Other hemoglobinopathies: Secondary | ICD-10-CM

## 2017-12-11 HISTORY — DX: Genetic carrier of other disease: Z14.8

## 2017-12-11 HISTORY — DX: Other hemoglobinopathies: D58.2

## 2017-12-11 NOTE — Progress Notes (Incomplete)
  Routine Prenatal Care Visit  Subjective  Mary Curry is a 27 y.o. G3P0020 at [redacted]w[redacted]d being seen today for ongoing prenatal care.  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  ----------------------------------------------------------------------------------- Patient reports {sx:[id number]}.    . Vag. Bleeding: None.  Movement: Absent. Denies leaking of fluid.  ----------------------------------------------------------------------------------- The following portions of the patient's history were reviewed and updated as appropriate: allergies, current medications, past family history, past medical history, past social history, past surgical history and problem list. Problem list updated.   Objective  Blood pressure 90/60, weight 154 lb (69.9 kg), last menstrual period 09/21/2017. Pregravid weight 156 lb (70.8 kg) Total Weight Gain -2 lb (-0.907 kg) Urinalysis: Urine Protein: Negative Urine Glucose: Negative  Fetal Status: Fetal Heart Rate (bpm): 145   Movement: Absent     General:  Alert, oriented and cooperative. Patient is in no acute distress.  Skin: Skin is warm and dry. No rash noted.   Cardiovascular: Normal heart rate noted  Respiratory: Normal respiratory effort, no problems with respiration noted  Abdomen: Soft, gravid, appropriate for gestational age. Pain/Pressure: Absent     Pelvic:  {Blank single:19197::"Cervical exam performed","Cervical exam deferred"}        Extremities: Normal range of motion.     Mental Status: Normal mood and affect. Normal behavior. Normal judgment and thought content.   Assessment   27 y.o. 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 He states he has already been tested and is hgb AA.       Supervision of low-risk pregnancy 11/08/2017 by Conard Novak, MD No       {Blank single:19197::"Term","Preterm"} labor symptoms and general obstetric precautions including but not limited to vaginal bleeding, contractions, leaking of fluid and fetal movement were reviewed in detail with the patient. Please refer to After Visit Summary for other counseling recommendations.   Return in about 1 month (around 01/08/2018) for Routine Prenatal Appointment.  Thomasene Mohair, MD, [facility], [facility] 5/[name]:48 PM

## 2017-12-11 NOTE — Progress Notes (Signed)
  Routine Prenatal Care Visit  Subjective  Rhylin Venters is a 27 y.o. G3P0020 at [redacted]w[redacted]d being seen today for ongoing prenatal care.  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  ----------------------------------------------------------------------------------- Patient reports headache intermittently.  Denies visual changes and neurologic symptoms.   Would like explanation of lab results, including hgb C trait.   . Vag. Bleeding: None.  Movement: Absent. Denies leaking of fluid.  ----------------------------------------------------------------------------------- The following portions of the patient's history were reviewed and updated as appropriate: allergies, current medications, past family history, past medical history, past social history, past surgical history and problem list. Problem list updated.  Objective  Blood pressure 90/60, weight 154 lb (69.9 kg), last menstrual period 09/21/2017. Pregravid weight 156 lb (70.8 kg) Total Weight Gain -2 lb (-0.907 kg) Urinalysis: Urine Protein: Negative Urine Glucose: Negative  Fetal Status: Fetal Heart Rate (bpm): 145   Movement: Absent     General:  Alert, oriented and cooperative. Patient is in no acute distress.  Skin: Skin is warm and dry. No rash noted.   Cardiovascular: Normal heart rate noted  Respiratory: Normal respiratory effort, no problems with respiration noted  Abdomen: Soft, gravid, appropriate for gestational age. Pain/Pressure: Absent     Pelvic:  Cervical exam deferred        Extremities: Normal range of motion.     Mental Status: Normal mood and affect. Normal behavior. Normal judgment and thought content.   Assessment   28 y.o. 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 He states he has already been tested and is hgb AA.       Supervision of low-risk pregnancy 11/08/2017 by Conard Novak, MD No     Please refer to After Visit Summary for other counseling recommendations.   - Declines genetic screening. - Discussed hgb C carrier status and implication. FOB states he was tested for hemoglobinopathies in past and was told her had normal hemoglobin.  Declines testing at this time for that reason.  - All other lab results reviewed and discussed.   Return in about 1 month (around 01/08/2018) for Routine Prenatal Appointment.  Thomasene Mohair, MD, [facility], [facility] 12/11/2017 2:48 PM

## 2018-01-09 ENCOUNTER — Ambulatory Visit (INDEPENDENT_AMBULATORY_CARE_PROVIDER_SITE_OTHER): Payer: Medicaid Other | Admitting: Obstetrics and Gynecology

## 2018-01-09 VITALS — BP 98/60 | Wt 160.0 lb

## 2018-01-09 DIAGNOSIS — Z3A15 15 weeks gestation of pregnancy: Secondary | ICD-10-CM

## 2018-01-09 DIAGNOSIS — Z3491 Encounter for supervision of normal pregnancy, unspecified, first trimester: Secondary | ICD-10-CM

## 2018-01-09 NOTE — Progress Notes (Signed)
ROB Abdominal pressure

## 2018-01-09 NOTE — Progress Notes (Signed)
    Routine Prenatal Care Visit  Subjective  Mary Curry is a 27 y.o. G3P0020 at 6018w5d being seen today for ongoing prenatal care.  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  ----------------------------------------------------------------------------------- Patient reports no complaints.    . Vag. Bleeding: None.  Movement: Absent. Denies leaking of fluid.  ----------------------------------------------------------------------------------- The following portions of the patient's history were reviewed and updated as appropriate: allergies, current medications, past family history, past medical history, past social history, past surgical history and problem list. Problem list updated.   Objective  Blood pressure 98/60, weight 160 lb (72.6 kg), last menstrual period 09/21/2017. Pregravid weight 156 lb (70.8 kg) Total Weight Gain 4 lb (1.814 kg) Urinalysis: Urine Protein: Negative Urine Glucose: Negative  Fetal Status: Fetal Heart Rate (bpm): 150   Movement: Absent     General:  Alert, oriented and cooperative. Patient is in no acute distress.  Skin: Skin is warm and dry. No rash noted.   Cardiovascular: Normal heart rate noted  Respiratory: Normal respiratory effort, no problems with respiration noted  Abdomen: Soft, gravid, appropriate for gestational age. Pain/Pressure: Present     Pelvic:  Cervical exam deferred        Extremities: Normal range of motion.     ental Status: Normal mood and affect. Normal behavior. Normal judgment and thought content.     Assessment   27 y.o. 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 He states he has already been tested and is hgb AA.       Supervision of low-risk pregnancy 11/08/2017 by Conard NovakJackson, Stephen D, MD No       Gestational age appropriate obstetric precautions including but not limited to vaginal bleeding, contractions, leaking of fluid and fetal movement were reviewed in detail with the patient.    Return in about 1 week (around 01/16/2018) for ROB-Evelia Waskey.  Vena AustriaAndreas Emmalin Jaquess, MD, [facility] FrederickFACOG [facility], [facility] 01/09/2018, 5:37 PM

## 2018-01-16 ENCOUNTER — Encounter: Payer: Medicaid Other | Admitting: Obstetrics and Gynecology

## 2018-01-23 ENCOUNTER — Ambulatory Visit (INDEPENDENT_AMBULATORY_CARE_PROVIDER_SITE_OTHER): Payer: Medicaid Other | Admitting: Obstetrics and Gynecology

## 2018-01-23 ENCOUNTER — Encounter: Payer: Self-pay | Admitting: Obstetrics and Gynecology

## 2018-01-23 VITALS — BP 102/66 | Wt 164.0 lb

## 2018-01-23 DIAGNOSIS — Z363 Encounter for antenatal screening for malformations: Secondary | ICD-10-CM

## 2018-01-23 DIAGNOSIS — Z3491 Encounter for supervision of normal pregnancy, unspecified, first trimester: Secondary | ICD-10-CM

## 2018-01-23 DIAGNOSIS — O09292 Supervision of pregnancy with other poor reproductive or obstetric history, second trimester: Secondary | ICD-10-CM

## 2018-01-23 DIAGNOSIS — D582 Other hemoglobinopathies: Secondary | ICD-10-CM

## 2018-01-23 DIAGNOSIS — O9989 Other specified diseases and conditions complicating pregnancy, childbirth and the puerperium: Secondary | ICD-10-CM

## 2018-01-23 DIAGNOSIS — Z3A17 17 weeks gestation of pregnancy: Secondary | ICD-10-CM

## 2018-01-23 NOTE — Progress Notes (Signed)
Routine Prenatal Care Visit  Subjective  Mary Curry is a 27 y.o. G3P0020 at 219w5d being seen today for ongoing prenatal care.  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  ----------------------------------------------------------------------------------- Patient reports no complaints.  Has noted some buttock pain.  No inciting events. Some constipation Contractions: Not present.  .   . Denies leaking of fluid.  ----------------------------------------------------------------------------------- The following portions of the patient's history were reviewed and updated as appropriate: allergies, current medications, past family history, past medical history, past social history, past surgical history and problem list. Problem list updated.   Objective  Blood pressure 102/66, weight 164 lb (74.4 kg), last menstrual period 09/21/2017. Pregravid weight 156 lb (70.8 kg) Total Weight Gain 8 lb (3.629 kg) Urinalysis:      Fetal Status: Fetal Heart Rate (bpm): 140         General:  Alert, oriented and cooperative. Patient is in no acute distress.  Skin: Skin is warm and dry. No rash noted.   Cardiovascular: Normal heart rate noted  Respiratory: Normal respiratory effort, no problems with respiration noted  Abdomen: Soft, gravid, appropriate for gestational age. Pain/Pressure: Present     Pelvic:  Cervical exam deferred        Extremities: Normal range of motion.     ental Status: Normal mood and affect. Normal behavior. Normal judgment and thought content.     Assessment   27 y.o. 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 He states he has already been tested and is hgb AA.       Supervision of low-risk pregnancy 11/08/2017 by Conard NovakJackson, Stephen D, MD No   Overview Signed 01/09/2018 10:22 AM by [name], Waris Rodger, MD    Clinic [facility] Prenatal Labs  Dating LMP=6 week US Blood type: AB/Positive/-- (04/10 1518)   Genetic Screen 1 Screen:    AFP:     Quad:     NIPS: Antibody:Negative (04/10 1518)  Anatomic US  Rubella: 13.70 (04/10 1518) Varicella: Immune  GTT  RPR: Non Reactive (04/10 1518)   Rhogam N/A HBsAg: Negative (04/10 1518)   TDaP vaccine                       Flu Shot: HIV: Non Reactive (04/10 1518)   Baby Food                                GBS:   Contraception  Pap: 11/08/17 NIL  CBB     CS/VBAC    Support Person                Gestational age appropriate obstetric precautions including but not limited to vaginal bleeding, contractions, leaking of fluid and fetal movement were reviewed in detail with the patient.   - anatomy scan next visit - bedside ultrasound female fetus Return in about 2 weeks (around 02/06/2018) for ROB and anatomy scan.  Vena AustriaAndreas Papa Piercefield, MD, [facility],  [facility] 01/23/2018, 4:18 PM

## 2018-01-23 NOTE — Progress Notes (Signed)
Mary Curry Butt cheeks hurt

## 2018-02-02 ENCOUNTER — Encounter (HOSPITAL_COMMUNITY): Payer: Self-pay | Admitting: Emergency Medicine

## 2018-02-02 ENCOUNTER — Emergency Department (HOSPITAL_COMMUNITY): Payer: Medicaid Other

## 2018-02-02 ENCOUNTER — Emergency Department (HOSPITAL_COMMUNITY)
Admission: EM | Admit: 2018-02-02 | Discharge: 2018-02-03 | Disposition: A | Discharge status: Home / Self Care | Payer: Medicaid Other | Attending: Emergency Medicine | Admitting: Emergency Medicine

## 2018-02-02 DIAGNOSIS — Z3A19 19 weeks gestation of pregnancy: Secondary | ICD-10-CM | POA: Insufficient documentation

## 2018-02-02 DIAGNOSIS — R1030 Lower abdominal pain, unspecified: Secondary | ICD-10-CM | POA: Insufficient documentation

## 2018-02-02 DIAGNOSIS — O26892 Other specified pregnancy related conditions, second trimester: Secondary | ICD-10-CM

## 2018-02-02 DIAGNOSIS — O26891 Other specified pregnancy related conditions, first trimester: Secondary | ICD-10-CM | POA: Diagnosis not present

## 2018-02-02 DIAGNOSIS — R109 Unspecified abdominal pain: Secondary | ICD-10-CM

## 2018-02-02 MED ORDER — ACETAMINOPHEN 500 MG PO TABS
1000.0000 mg | ORAL_TABLET | Freq: Once | ORAL | Status: AC
  Administered 2018-02-03: 1000 mg via ORAL
  Filled 2018-02-02: qty 2

## 2018-02-02 NOTE — ED Triage Notes (Addendum)
Pt comes to ed, via pov, c/o of new onset abdominal pain and back pain. Pt describes sharp pain lower bilateral abdominal pain radiating to back. No nausea, vomiting, diarrhea. Pain started today at 2pm. Pt verbalizes she is up to date with all obgyn appoints, no current medications other than prenatals, first child. Friend in room

## 2018-02-02 NOTE — ED Notes (Signed)
Fetal heart tone via dop 146

## 2018-02-02 NOTE — ED Provider Notes (Signed)
[facility]-EMERGENCY DEPT Provider Note   CSN: [id number] Arrival date & time: 02/02/18  2129     History   Chief Complaint Chief Complaint  Patient presents with  . Abdominal Pain    19 weeks preg  . Back Pain    HPI Mary Curry is a 27 y.o. female.  The history is provided by the patient and medical records.  Abdominal Pain    Back Pain   Associated symptoms include abdominal pain.     27 year old G2, P0 approximately [redacted] weeks gestation here with abdominal pain.  States thus far in pregnancy she has not had any major complications.  States yesterday she started having some mild lower abdominal pain, this is significantly intensified today.  States it feels like some cramps with intermittent sharp, stabbing pains.  States pain all throughout her lower abdomen, but has started to feel it in her upper abdomen in the past hour or so as well.  She denies any vaginal bleeding, loss of fluid, vaginal discharge.  She has had STD testing that is been negative.  She denies any new sexual partners.  She denies urinary symptoms. No fever/chills.  Has been eating/drinking well.  Past Medical History:  Diagnosis Date  . 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 Medical history non-contributory     Patient Active Problem List   Diagnosis Date Noted  . 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 Supervision of low-risk pregnancy 11/08/2017  . Miscarriage within last 12 months 10/30/2017    Past Surgical History:  Procedure Laterality Date  . DILATION AND CURETTAGE OF UTERUS N/A 06/29/2017   Procedure: DILATATION AND CURETTAGE;  Surgeon: Vena Austria, MD;  Location: [facility] ORS;  Service: Gynecology;  Laterality: N/A;  . NO PAST SURGERIES       OB History    Gravida  3   Para  0   Term      Preterm      AB  2   Living  0     SAB  2   TAB      Ectopic      Multiple      Live Births               Home Medications    Prior to  Admission medications   Medication Sig Start Date End Date Taking? Authorizing Provider  Doxylamine-Pyridoxine ER (BONJESTA) 20-20 MG TBCR Take 1 tablet by mouth 2 (two) times daily as needed. Patient not taking: Reported on 01/09/2018 11/13/17   Nadara Mustard, MD  Prenat-FeCbn-FeAsp-Meth-FA-DHA (PRENATE MINI) 18-0.6-0.4-350 MG CAPS Take 1 tablet by mouth daily. 11/08/17   Conard Novak, MD    Family History No family history on file.  Social History Social History   Tobacco Use  . Smoking status: Never Smoker  . Smokeless tobacco: Never Used  Substance Use Topics  . Alcohol use: No  . Drug use: No     Allergies   Patient has no known allergies.   Review of Systems Review of Systems  Gastrointestinal: Positive for abdominal pain.  Musculoskeletal: Positive for back pain.  All other systems reviewed and are negative.    Physical Exam Updated Vital Signs BP 98/74 (BP Location: Left Arm)   Pulse 73   Temp 97.8 F (36.6 C) (Oral)   Resp (!) 21   LMP 09/21/2017 (Exact Date)   SpO2 100%   Physical Exam  Constitutional: She is oriented to person, place, and  time. She appears well-developed and well-nourished.  HENT:  Head: Normocephalic and atraumatic.  Mouth/Throat: Oropharynx is clear and moist.  Eyes: Pupils are equal, round, and reactive to light. Conjunctivae and EOM are normal.  Neck: Normal range of motion.  Cardiovascular: Normal rate, regular rhythm and normal heart sounds.  Pulmonary/Chest: Effort normal and breath sounds normal.  Abdominal: Soft. Bowel sounds are normal. There is tenderness. There is no rigidity and no guarding.  Gravid abdomen, some mild tenderness throughout lower abdomen, pain relieved when pressure applied during exam; no peritoneal signs  Musculoskeletal: Normal range of motion.  Neurological: She is alert and oriented to person, place, and time.  Skin: Skin is warm and dry.  Psychiatric: She has a normal mood and affect.    Nursing note and vitals reviewed.    ED Treatments / Results  Labs (all labs ordered are listed, but only abnormal results are displayed) Labs Reviewed  CBC WITH DIFFERENTIAL/PLATELET - Abnormal; Notable for the following components:      Result Value   WBC 14.6 (*)    Hemoglobin 11.6 (*)    HCT 31.7 (*)    MCHC 36.6 (*)    Neutro Abs 8.9 (*)    Lymphs Abs 4.4 (*)    All other components within normal limits  COMPREHENSIVE METABOLIC PANEL - Abnormal; Notable for the following components:   Potassium 3.4 (*)    All other components within normal limits  URINALYSIS, ROUTINE W REFLEX MICROSCOPIC - Abnormal; Notable for the following components:   APPearance CLOUDY (*)    Specific Gravity, Urine 1.003 (*)    Ketones, ur 5 (*)    Leukocytes, UA TRACE (*)    Bacteria, UA RARE (*)    All other components within normal limits  URINE CULTURE  LIPASE, BLOOD    EKG None  Radiology [place] [facility] Limited  Result Date: 02/03/2018 CLINICAL DATA:  Cramping for 2 days. Estimated gestational age by LMP is 19 weeks. EXAM: LIMITED OBSTETRIC ULTRASOUND FINDINGS: Number of Fetuses: 1 Heart Rate:  136 bpm Movement: Fetal movement is observed. Presentation: Cephalic presentation. Placental Location: Posterior placenta. Previa: No previa. Amniotic Fluid (Subjective):  Within normal limits. BPD: 4.3 cm 19 w  0 d MATERNAL FINDINGS: Cervix:  Cervical length measures 3.4 cm.  Cervix appears closed. Uterus/Adnexae: Limited visualization. No focal abnormalities identified. IMPRESSION: Single intrauterine pregnancy with cephalic presentation and posterior placenta. Fetal motion and cardiac activity are observed. No acute complication is demonstrated on limited imaging. This exam is performed on an emergent basis and does not comprehensively evaluate fetal size, dating, or anatomy; follow-up complete OB US should be considered if further fetal assessment is warranted. Electronically Signed   By: Burman NievesWilliam  Stevens M.D.    On: 02/03/2018 00:49    Procedures Procedures (including critical care time)  Medications Ordered in ED Medications - No data to display   Initial Impression / Assessment and Plan / ED Course  I have reviewed the triage vital signs and the nursing notes.  Pertinent labs & imaging results that were available during my care of the patient were reviewed by me and considered in my medical decision making (see chart for details).  27 year old G2P0 approximately [redacted] weeks gestation, here with lower abdominal pain.  She reports diffuse lower abdominal cramping with intermittent sharp, stabbing sensations that just last a few seconds.  She denies any loss of fluid, vaginal bleeding, or vaginal discharge.  She has had STD testing that was negative.  No  new sexual partner.  On exam she has gravid abdomen, very mild tenderness throughout lower abdomen but this seems resolved when applying pressure.  She is nontoxic in appearance.  Denies any urinary symptoms.  Will obtain screening labs, urinalysis, OB ultrasound. Tylenol given.  Labs overall reassuring.  Does have a mild leukocytosis of 14.6.  UA without any signs of infection, appears contaminated with numerous squamous cells.  Ultrasound reassuring without any noted complications.  After tylenol here, patient resting comfortably.  States cramping has eased a lot, still feels some "twinges" every now and again.  Patient continues to appear well in no acute distress.  Feel she is stable for discharge.  She has previously scheduled follow-up with her OB/GYN in 5 days, she understands to follow-up sooner for any new or worsening symptoms.  She will return to the ED or women's hospital for any worsening abdominal pain, vaginal bleeding, loss of fluid, or signs of early labor.  Final Clinical Impressions(s) / ED Diagnoses   Final diagnoses:  Abdominal pain during pregnancy in second trimester    ED Discharge Orders    None       Garlon Hatchet,  PA-C 02/03/18 Herold Harms    Shaune Pollack, MD 02/03/18 1940

## 2018-02-03 LAB — URINALYSIS, ROUTINE W REFLEX MICROSCOPIC
Bilirubin Urine: NEGATIVE
Glucose, UA: NEGATIVE mg/dL
HGB URINE DIPSTICK: NEGATIVE
Ketones, ur: 5 mg/dL — AB
Nitrite: NEGATIVE
Protein, ur: NEGATIVE mg/dL
SPECIFIC GRAVITY, URINE: 1.003 — AB (ref 1.005–1.030)
pH: 6 (ref 5.0–8.0)

## 2018-02-03 LAB — COMPREHENSIVE METABOLIC PANEL
ALK PHOS: 62 U/L (ref 38–126)
ALT: 15 U/L (ref 0–44)
AST: 17 U/L (ref 15–41)
Albumin: 3.7 g/dL (ref 3.5–5.0)
Anion gap: 7 (ref 5–15)
BUN: 7 mg/dL (ref 6–20)
CALCIUM: 9.1 mg/dL (ref 8.9–10.3)
CO2: 23 mmol/L (ref 22–32)
CREATININE: 0.53 mg/dL (ref 0.44–1.00)
Chloride: 107 mmol/L (ref 98–111)
GFR calc non Af Amer: >60 mL/min (ref >60)
Glucose, Bld: 81 mg/dL (ref 70–99)
Potassium: 3.4 mmol/L — ABNORMAL LOW (ref 3.5–5.1)
Sodium: 137 mmol/L (ref 135–145)
Total Bilirubin: 0.3 mg/dL (ref 0.3–1.2)
Total Protein: 7.1 g/dL (ref 6.5–8.1)

## 2018-02-03 LAB — CBC WITH DIFFERENTIAL/PLATELET
Basophils Absolute: 0 10*3/uL (ref 0.0–0.1)
Basophils Relative: 0 %
Eosinophils Absolute: 0.4 10*3/uL (ref 0.0–0.7)
Eosinophils Relative: 3 %
HEMATOCRIT: 31.7 % — AB (ref 36.0–46.0)
HEMOGLOBIN: 11.6 g/dL — AB (ref 12.0–15.0)
LYMPHS ABS: 4.4 10*3/uL — AB (ref 0.7–4.0)
LYMPHS PCT: 30 %
MCH: 29 pg (ref 26.0–34.0)
MCHC: 36.6 g/dL — ABNORMAL HIGH (ref 30.0–36.0)
MCV: 79.3 fL (ref 78.0–100.0)
Monocytes Absolute: 0.8 10*3/uL (ref 0.1–1.0)
Monocytes Relative: 6 %
NEUTROS ABS: 8.9 10*3/uL — AB (ref 1.7–7.7)
Neutrophils Relative %: 61 %
Platelets: 217 10*3/uL (ref 150–400)
RBC: 4 MIL/uL (ref 3.87–5.11)
RDW: 13.7 % (ref 11.5–15.5)
WBC: 14.6 10*3/uL — AB (ref 4.0–10.5)

## 2018-02-03 LAB — LIPASE, BLOOD: LIPASE: 38 U/L (ref 11–51)

## 2018-02-03 NOTE — Discharge Instructions (Signed)
Follow-up with your OB-GYN as scheduled. Return to the ED or women's hospital for any new/acute changes including worsening pain, vaginal bleeding, loss of fluid, discharge, fever, etc.

## 2018-02-04 LAB — URINE CULTURE

## 2018-02-08 ENCOUNTER — Ambulatory Visit (INDEPENDENT_AMBULATORY_CARE_PROVIDER_SITE_OTHER): Payer: Medicaid Other

## 2018-02-08 ENCOUNTER — Ambulatory Visit (INDEPENDENT_AMBULATORY_CARE_PROVIDER_SITE_OTHER): Payer: Medicaid Other | Admitting: Obstetrics and Gynecology

## 2018-02-08 VITALS — BP 122/54 | Wt 166.0 lb

## 2018-02-08 DIAGNOSIS — Z3491 Encounter for supervision of normal pregnancy, unspecified, first trimester: Secondary | ICD-10-CM

## 2018-02-08 DIAGNOSIS — Z148 Genetic carrier of other disease: Secondary | ICD-10-CM

## 2018-02-08 DIAGNOSIS — Z3A20 20 weeks gestation of pregnancy: Secondary | ICD-10-CM

## 2018-02-08 DIAGNOSIS — Z363 Encounter for antenatal screening for malformations: Secondary | ICD-10-CM | POA: Diagnosis not present

## 2018-02-08 DIAGNOSIS — Z0489 Encounter for examination and observation for other specified reasons: Secondary | ICD-10-CM

## 2018-02-08 DIAGNOSIS — IMO0002 Reserved for concepts with insufficient information to code with codable children: Secondary | ICD-10-CM

## 2018-02-08 NOTE — Progress Notes (Signed)
ROB Anatomy scan/ It IS A BOY!

## 2018-02-08 NOTE — Progress Notes (Signed)
Routine Prenatal Care Visit  Subjective  Mary Curry is a 27 y.o. G3P0020 at 4944w0d being seen today for ongoing prenatal care.  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  ----------------------------------------------------------------------------------- Patient reports no complaints.   Contractions: Not present. Vag. Bleeding: None.  Movement: Present. Denies leaking of fluid.  ----------------------------------------------------------------------------------- The following portions of the patient's history were reviewed and updated as appropriate: allergies, current medications, past family history, past medical history, past social history, past surgical history and problem list. Problem list updated.   Objective  Blood pressure (!) 122/54, weight 166 lb (75.3 kg), last menstrual period 09/21/2017. Pregravid weight 156 lb (70.8 kg) Total Weight Gain 10 lb (4.536 kg) Urinalysis:      Fetal Status: Fetal Heart Rate (bpm): 150   Movement: Present     General:  Alert, oriented and cooperative. Patient is in no acute distress.  Skin: Skin is warm and dry. No rash noted.   Cardiovascular: Normal heart rate noted  Respiratory: Normal respiratory effort, no problems with respiration noted  Abdomen: Soft, gravid, appropriate for gestational age. Pain/Pressure: Present     Pelvic:  Cervical exam deferred        Extremities: Normal range of motion.     ental Status: Normal mood and affect. Normal behavior. Normal judgment and thought content.   [place] Ob Comp + 14 Wk  Result Date: 02/08/2018 ULTRASOUND REPORT Patient Name: Mary Curry DOB: 12/31/1990 MRN: [id number] Location: [facility] Date of Service: 02/08/2018 Indications:Anatomy Ultrasound Findings: Mason JimSingleton intrauterine pregnancy is visualized with FHR at 138 BPM.  Biometrics give an (U/S) Gestational age of 6449w4d and an (U/S) EDD of 07/01/2018; this correlates with the clinically established Estimated Date of Delivery: 06/28/18 Fetal presentation is Cephalic. EFW: 11oz. Placenta: Anterior, grade 0. AFI: Subjectively normal. Anatomic survey is incomplete for suboptimal RVOT, nose/lip, and profile and normal; Gender - female.  Right Ovary is normal in appearance. Left Ovary is normal appearance. Survey of the adnexa demonstrates no adnexal masses. There is no free peritoneal fluid in the cul de sac. Impression: 1. 6644w0d Viable Singleton Intrauterine pregnancy by U/S. 2. (U/S) EDD is consistent with Clinically established Estimated Date of Delivery: 06/28/18 . 3. Normal Anatomy Scan Recommendations: 1.Clinical correlation with the patient's History and Physical Exam. 2. Follow up in 4 weeks to complete anatomic survey Willette AlmaKristen Priestley, RDMS, RVT  There is a singleton gestation with subjectively normal amniotic fluid volume. The fetal biometry correlates with established dating. Detailed evaluation of the fetal anatomy was performed.The fetal anatomical survey appears within normal limits within the resolution of ultrasound as described above, with the above noted views incomplete.  It must be noted that a normal ultrasound is unable to rule out fetal aneuploidy.  Vena AustriaAndreas Rmani Kapusta, MD, Merlinda FrederickFACOG [facility], [facility] 02/08/2018, 3:04 PM   [place] Ob Limited  Result Date: 02/03/2018 CLINICAL DATA:  Cramping for 2 days. Estimated gestational age by LMP is 19 weeks. EXAM: LIMITED OBSTETRIC ULTRASOUND FINDINGS: Number of Fetuses: 1 Heart Rate:  136 bpm Movement: Fetal movement is observed. Presentation: Cephalic presentation. Placental Location: Posterior placenta. Previa: No previa. Amniotic Fluid (Subjective):  Within normal limits. BPD: 4.3 cm 19 w  0 d MATERNAL FINDINGS: Cervix:  Cervical length measures 3.4 cm.  Cervix appears closed. Uterus/Adnexae: Limited  visualization. No focal abnormalities identified. IMPRESSION: Single  intrauterine pregnancy with cephalic presentation and posterior placenta. Fetal motion and cardiac activity are observed. No acute complication is demonstrated on limited imaging. This exam is performed on an emergent basis and does not comprehensively evaluate fetal size, dating, or anatomy; follow-up complete OB US should be considered if further fetal assessment is warranted. Electronically Signed   By: Burman Nieves M.D.   On: 02/03/2018 00:49     Assessment   26 y.o. 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 He states he has already been tested and is hgb AA.       Supervision of low-risk pregnancy 11/08/2017 by Conard Novak, MD No   Overview Addendum 02/08/2018  3:09 PM by Vena Austria, MD    Clinic [facility]renatal Labs  Dating LMP=6 week [place] Blood type: AB/Positive/-- (04/10 1518)   Genetic Screen Declined trisomy testing Inheritest negative Antibody:Negative (04/10 1518)  Anatomic [place] Female, incomplete [ ]  complete 24 weeks Rubella: 13.70 (04/10 1518) Varicella: Immune  GTT  RPR: Non Reactive (04/10 1518)   Rhogam N/A HBsAg: Negative (04/10 1518)   TDaP vaccine                       Flu Shot: HIV: Non Reactive (04/10 1518)   Baby Food                                GBS:   Contraception  Pap: 11/08/17 NIL  CBB     CS/VBAC    Support Person                Gestational age appropriate obstetric precautions including but not limited to vaginal bleeding, contractions, leaking of fluid and fetal movement were reviewed in detail with the patient.    Return in about 1 month (around 03/08/2018) for ROB and  follow up anatomy scan.  Vena Austria, MD, Evern Core [facility], [facility] 02/08/2018, 3:07 PM

## 2018-02-22 ENCOUNTER — Encounter: Payer: Medicaid Other | Admitting: Certified Nurse Midwife

## 2018-02-22 ENCOUNTER — Encounter: Payer: Self-pay | Admitting: Obstetrics and Gynecology

## 2018-02-22 NOTE — Telephone Encounter (Signed)
I called and left voicemail for patient to call back to confirm schedule appt

## 2018-03-09 ENCOUNTER — Other Ambulatory Visit: Payer: Medicaid Other

## 2018-03-09 ENCOUNTER — Encounter: Payer: Medicaid Other | Admitting: Obstetrics and Gynecology

## 2018-03-12 ENCOUNTER — Ambulatory Visit (INDEPENDENT_AMBULATORY_CARE_PROVIDER_SITE_OTHER): Payer: Medicaid Other | Admitting: Obstetrics and Gynecology

## 2018-03-12 ENCOUNTER — Ambulatory Visit (INDEPENDENT_AMBULATORY_CARE_PROVIDER_SITE_OTHER): Payer: Medicaid Other

## 2018-03-12 VITALS — BP 100/66 | Wt 175.0 lb

## 2018-03-12 DIAGNOSIS — Z113 Encounter for screening for infections with a predominantly sexual mode of transmission: Secondary | ICD-10-CM

## 2018-03-12 DIAGNOSIS — IMO0002 Reserved for concepts with insufficient information to code with codable children: Secondary | ICD-10-CM

## 2018-03-12 DIAGNOSIS — Z0489 Encounter for examination and observation for other specified reasons: Secondary | ICD-10-CM

## 2018-03-12 DIAGNOSIS — Z3490 Encounter for supervision of normal pregnancy, unspecified, unspecified trimester: Secondary | ICD-10-CM

## 2018-03-12 DIAGNOSIS — Z3A24 24 weeks gestation of pregnancy: Secondary | ICD-10-CM

## 2018-03-12 DIAGNOSIS — Z362 Encounter for other antenatal screening follow-up: Secondary | ICD-10-CM

## 2018-03-12 DIAGNOSIS — Z131 Encounter for screening for diabetes mellitus: Secondary | ICD-10-CM

## 2018-03-12 DIAGNOSIS — Z3491 Encounter for supervision of normal pregnancy, unspecified, first trimester: Secondary | ICD-10-CM

## 2018-03-12 DIAGNOSIS — O09292 Supervision of pregnancy with other poor reproductive or obstetric history, second trimester: Secondary | ICD-10-CM

## 2018-03-12 LAB — POCT URINALYSIS DIPSTICK OB
GLUCOSE, UA: NEGATIVE — AB
POC,PROTEIN,UA: NEGATIVE

## 2018-03-12 NOTE — Progress Notes (Signed)
Routine Prenatal Care Visit  Subjective  Mary Curry is a 27 y.o. G3P0020 at 3257w4d being seen today for ongoing prenatal care.  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  ----------------------------------------------------------------------------------- Patient reports no complaints.   Contractions: Not present. Vag. Bleeding: None.  Movement: Present. Denies leaking of fluid.  ----------------------------------------------------------------------------------- The following portions of the patient's history were reviewed and updated as appropriate: allergies, current medications, past family history, past medical history, past social history, past surgical history and problem list. Problem list updated.   Objective  Blood pressure 100/66, weight 175 lb (79.4 kg), last menstrual period 09/21/2017. Pregravid weight 156 lb (70.8 kg) Total Weight Gain 19 lb (8.618 kg) Urinalysis:      Fetal Status: Fetal Heart Rate (bpm): 125   Movement: Present     General:  Alert, oriented and cooperative. Patient is in no acute distress.  Skin: Skin is warm and dry. No rash noted.   Cardiovascular: Normal heart rate noted  Respiratory: Normal respiratory effort, no problems with respiration noted  Abdomen: Soft, gravid, appropriate for gestational age. Pain/Pressure: Present     Pelvic:  Cervical exam deferred        Extremities: Normal range of motion.     ental Status: Normal mood and affect. Normal behavior. Normal judgment and thought content.   [place] Ob Follow Up  Result Date: 03/12/2018 ULTRASOUND REPORT Location: [facility] Date of Service: 03/12/2018 Patient Name: Mary Curry DOB: 01/21/1991 MRN: [id number] Indications: Anatomy follow up ultrasound Findings: Mary Curry intrauterine pregnancy is visualized with FHR at 139  BPM. Biometrics give an (U/S) Gestational age of 5757w4d and an (U/S) EDD of 06/28/2018; this correlates with the clinically established EDD of Estimated Date of Delivery: 06/28/18. Fetal presentation is Cephalic. Placenta: Anterior, Grade 1. AFI: subjectively normal. Anatomic survey is complete. Survey of the adnexa demonstrates no adnexal masses. There is no free peritoneal fluid in the cul de sac. Impression: 1. 5557w4d Viable Singleton Intrauterine pregnancy previously established criteria. 2. Normal Anatomy Scan is now complete Recommendations: 1.Clinical correlation with the patient's History and Physical Exam. Mital bahen P Patel, RDMS There is a singleton gestation with subjectively normal amniotic fluid volume.  Limited evaluation of the fetal anatomy was performed today, focusing on on anatomic structures not fully visualized at the time of prior study.The visualized fetal anatomical survey appears within normal limits within the resolution of ultrasound as described above, and the anatomic survey is now complete.  It must be noted that a normal ultrasound is unable to rule out fetal aneuploidy.  [name], MD, Evern CoreFACOG [facility], [facility] 03/12/2018, 3:10 PM     Assessment   26 y.o. 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 He states he has already been tested and is hgb AA.       Supervision of low-risk pregnancy 11/08/2017 by Conard NovakJackson, Stephen D, MD No   Overview Addendum 02/08/2018  3:09 PM by [name], Mico Spark, MD    [facility]  Dating LMP=6  week US Blood type: AB/Positive/-- (04/10 1518)   Genetic Screen Declined trisomy testing Inheritest negative  Antibody:Negative (04/10 1518)  Anatomic US Female, incomplete [ ]  complete 24 weeks Rubella: 13.70 (04/10 1518) Varicella: Immune  GTT  RPR: Non Reactive (04/10 1518)   Rhogam N/A HBsAg: Negative (04/10 1518)   TDaP vaccine                       Flu Shot: HIV: Non Reactive (04/10 1518)   Baby Food                                GBS:   Contraception  Pap: 11/08/17 NIL  CBB     CS/VBAC    Support Person                Gestational age appropriate obstetric precautions including but not limited to vaginal bleeding, contractions, leaking of fluid and fetal movement were reviewed in detail with the patient.   - anatomy scan complete  Return in about 4 weeks (around 04/09/2018) for ROB and 28 week labs.  Vena AustriaAndreas [name], MD, Merlinda FrederickFACOG [facility], [facility] 03/12/2018, 3:33 PM

## 2018-03-12 NOTE — Progress Notes (Signed)
ROB Pt thinks she has insomnia

## 2018-04-09 ENCOUNTER — Other Ambulatory Visit: Payer: Medicaid Other

## 2018-04-09 ENCOUNTER — Ambulatory Visit (INDEPENDENT_AMBULATORY_CARE_PROVIDER_SITE_OTHER): Payer: Medicaid Other | Admitting: Obstetrics and Gynecology

## 2018-04-09 VITALS — BP 108/68 | Wt 183.0 lb

## 2018-04-09 DIAGNOSIS — Z3A28 28 weeks gestation of pregnancy: Secondary | ICD-10-CM

## 2018-04-09 DIAGNOSIS — Z3490 Encounter for supervision of normal pregnancy, unspecified, unspecified trimester: Secondary | ICD-10-CM

## 2018-04-09 DIAGNOSIS — Z131 Encounter for screening for diabetes mellitus: Secondary | ICD-10-CM

## 2018-04-09 DIAGNOSIS — Z113 Encounter for screening for infections with a predominantly sexual mode of transmission: Secondary | ICD-10-CM

## 2018-04-09 LAB — POCT URINALYSIS DIPSTICK OB
GLUCOSE, UA: NEGATIVE
PROTEIN: NEGATIVE

## 2018-04-09 NOTE — Progress Notes (Signed)
ROB 28 week labs 

## 2018-04-09 NOTE — Progress Notes (Signed)
Routine Prenatal Care Visit  Subjective  Mary Curry is a 27 y.o. G3P0020 at [redacted]w[redacted]d being seen today for ongoing prenatal care.  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  ----------------------------------------------------------------------------------- Patient reports no complaints.    .  .   . Denies leaking of fluid.  ----------------------------------------------------------------------------------- The following portions of the patient's history were reviewed and updated as appropriate: allergies, current medications, past family history, past medical history, past social history, past surgical history and problem list. Problem list updated.   Objective  Blood pressure 108/68, weight 183 lb (83 kg), last menstrual period 09/21/2017. Pregravid weight 156 lb (70.8 kg) Total Weight Gain 27 lb (12.2 kg) Urinalysis:      Fetal Status:           General:  Alert, oriented and cooperative. Patient is in no acute distress.  Skin: Skin is warm and dry. No rash noted.   Cardiovascular: Normal heart rate noted  Respiratory: Normal respiratory effort, no problems with respiration noted  Abdomen: Soft, gravid, appropriate for gestational age.       Pelvic:  Cervical exam deferred        Extremities: Normal range of motion.     ental Status: Normal mood and affect. Normal behavior. Normal judgment and thought content.     Assessment   27 y.o. 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 He states he has already been tested  and is hgb AA.       Supervision of low-risk pregnancy 11/08/2017 by Conard Novak, MD No   Overview Addendum 03/12/2018  3:12 PM by Vena Austria, MD    Clinic [facility]atal Labs  Dating LMP=6 week [place] Blood type: AB/Positive/-- (04/10 1518)   Genetic Screen Declined trisomy testing Inheritest negative Antibody:Negative (04/10 1518)  Anatomic [place] Female, incomplete [X]  complete 24 weeks Rubella: 13.70 (04/10 1518) Varicella: Immune  GTT  RPR: Non Reactive (04/10 1518)   Rhogam N/A HBsAg: Negative (04/10 1518)   TDaP vaccine                       Flu Shot: HIV: Non Reactive (04/10 1518)   Baby Food                                GBS:   Contraception  Pap: 11/08/17 NIL  CBB     CS/VBAC    Support Person                Gestational age appropriate obstetric precautions including but not limited to vaginal bleeding, contractions, leaking of fluid and fetal movement were reviewed in detail with the patient.   - Discussed benefits of influenza vaccination during pregnancy.  The CDC recommends influenza vaccination for all pregnant patient regardless of trimester.  Besides the benefits of preventing influenza in the mother we discussed transfer of maternal antibodies to fetus as well as secretion of antibodies in breast milk  which are protective for the infant.    In case of positive influenza test in the patient or a close household contact, the use of Tamiflu is also recommended by the CDC.    Return in about 2 weeks (around 04/23/2018) for ROB.  Vena Austria, MD, Evern Core [facility], [facility] 04/09/2018, 3:56 PM

## 2018-04-10 ENCOUNTER — Other Ambulatory Visit: Payer: Self-pay | Admitting: Obstetrics and Gynecology

## 2018-04-10 ENCOUNTER — Encounter: Payer: Self-pay | Admitting: Obstetrics and Gynecology

## 2018-04-10 DIAGNOSIS — O99013 Anemia complicating pregnancy, third trimester: Secondary | ICD-10-CM | POA: Insufficient documentation

## 2018-04-10 LAB — 28 WEEK RH+PANEL
Basophils Absolute: 0 10*3/uL (ref 0.0–0.2)
Basos: 0 %
EOS (ABSOLUTE): 0.2 10*3/uL (ref 0.0–0.4)
Eos: 2 %
Gestational Diabetes Screen: 99 mg/dL (ref 65–139)
HIV Screen 4th Generation wRfx: NONREACTIVE
Hematocrit: 31.2 % — ABNORMAL LOW (ref 34.0–46.6)
Hemoglobin: 10.6 g/dL — ABNORMAL LOW (ref 11.1–15.9)
IMMATURE GRANS (ABS): 0.1 10*3/uL (ref 0.0–0.1)
IMMATURE GRANULOCYTES: 1 %
LYMPHS: 24 %
Lymphocytes Absolute: 3.1 10*3/uL (ref 0.7–3.1)
MCH: 28.9 pg (ref 26.6–33.0)
MCHC: 34 g/dL (ref 31.5–35.7)
MCV: 85 fL (ref 79–97)
MONOCYTES: 8 %
Monocytes Absolute: 1 10*3/uL — ABNORMAL HIGH (ref 0.1–0.9)
NEUTROS PCT: 65 %
Neutrophils Absolute: 8.2 10*3/uL — ABNORMAL HIGH (ref 1.4–7.0)
Platelets: 209 10*3/uL (ref 150–450)
RBC: 3.67 x10E6/uL — ABNORMAL LOW (ref 3.77–5.28)
RDW: 13.8 % (ref 12.3–15.4)
RPR Ser Ql: NONREACTIVE
WBC: 12.7 10*3/uL — ABNORMAL HIGH (ref 3.4–10.8)

## 2018-04-10 MED ORDER — FUSION 65-65-25-30 MG PO CAPS: 1.0000 | ORAL_CAPSULE | Freq: Every day | ORAL | 11 refills | Status: DC

## 2018-04-13 NOTE — Telephone Encounter (Signed)
[facility] [place] [facility] states Fusion capsules that were rx'd is no longer available.  Pt wants to buy something otc.  What should she get or should she wait until she gets back to [place]?  Pharm # [phone] Per pharm, pt isn't expecting answer until Monday.

## 2018-04-23 ENCOUNTER — Encounter: Payer: Medicaid Other | Admitting: Obstetrics and Gynecology

## 2018-04-30 ENCOUNTER — Encounter: Payer: Medicaid Other | Admitting: Obstetrics and Gynecology

## 2018-05-02 ENCOUNTER — Ambulatory Visit (INDEPENDENT_AMBULATORY_CARE_PROVIDER_SITE_OTHER): Payer: Medicaid Other | Admitting: Obstetrics and Gynecology

## 2018-05-02 VITALS — BP 122/76 | Wt 190.0 lb

## 2018-05-02 DIAGNOSIS — Z23 Encounter for immunization: Secondary | ICD-10-CM

## 2018-05-02 DIAGNOSIS — Z3490 Encounter for supervision of normal pregnancy, unspecified, unspecified trimester: Secondary | ICD-10-CM

## 2018-05-02 DIAGNOSIS — Z3A31 31 weeks gestation of pregnancy: Secondary | ICD-10-CM

## 2018-05-02 DIAGNOSIS — O99013 Anemia complicating pregnancy, third trimester: Secondary | ICD-10-CM

## 2018-05-02 NOTE — Progress Notes (Signed)
Routine Prenatal Care Visit  Subjective  Mary Curry is a 27 y.o. G3P0020 at [redacted]w[redacted]d being seen today for ongoing prenatal care.  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  ----------------------------------------------------------------------------------- Patient reports no complaints.   Contractions: Irregular. Vag. Bleeding: None.  Movement: Present. Denies leaking of fluid.  ----------------------------------------------------------------------------------- The following portions of the patient's history were reviewed and updated as appropriate: allergies, current medications, past family history, past medical history, past social history, past surgical history and problem list. Problem list updated.   Objective  Blood pressure 122/76, weight 190 lb (86.2 kg), last menstrual period 09/21/2017. Pregravid weight 156 lb (70.8 kg) Total Weight Gain 34 lb (15.4 kg) Urinalysis:      Fetal Status: Fetal Heart Rate (bpm): 140 Fundal Height: 32 cm Movement: Present     General:  Alert, oriented and cooperative. Patient is in no acute distress.  Skin: Skin is warm and dry. No rash noted.   Cardiovascular: Normal heart rate noted  Respiratory: Normal respiratory effort, no problems with respiration noted  Abdomen: Soft, gravid, appropriate for gestational age. Pain/Pressure: Absent     Pelvic:  Cervical exam deferred        Extremities: Normal range of motion.     ental Status: Normal mood and affect. Normal behavior. Normal judgment and thought content.     Assessment   27 y.o. 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 He states he has already been tested and is hgb AA.       Supervision of low-risk pregnancy 11/08/2017 by Conard Novak, MD No   Overview Addendum 05/02/2018  3:00 PM by Vena Austria, MD    Clinic [facility]  Dating LMP=6 week [place] Blood type: AB/Positive/-- (04/10 1518)   Genetic Screen Declined trisomy testing Inheritest negative Antibody:Negative (04/10 1518)  Anatomic [place] Female, incomplete [X]  complete 24 weeks Rubella: 13.70 (04/10 1518) Varicella: Immune  GTT 99 RPR: Non Reactive (04/10 1518)   Rhogam N/A HBsAg: Negative (04/10 1518)   TDaP vaccine                       Flu Shot:05/02/18 HIV: Non Reactive (04/10 1518)   Baby Food                                GBS:   Contraception  Pap: 11/08/17 NIL  CBB     CS/VBAC    Support Person                Gestational age appropriate obstetric precautions including but not limited to vaginal bleeding, contractions, leaking of fluid and fetal movement were reviewed in detail with the patient.    Return in about 2 weeks (around 05/16/2018) for ROB.  Vena Austria, MD, [facility], [facility] 05/02/2018,  3:13 PM

## 2018-05-02 NOTE — Progress Notes (Signed)
ROB Flu vaccine   

## 2018-05-11 ENCOUNTER — Encounter (HOSPITAL_COMMUNITY): Payer: Self-pay

## 2018-05-16 ENCOUNTER — Ambulatory Visit (INDEPENDENT_AMBULATORY_CARE_PROVIDER_SITE_OTHER): Payer: Medicaid Other | Admitting: Obstetrics and Gynecology

## 2018-05-16 VITALS — BP 108/64 | Wt 194.0 lb

## 2018-05-16 DIAGNOSIS — O99013 Anemia complicating pregnancy, third trimester: Secondary | ICD-10-CM

## 2018-05-16 DIAGNOSIS — Z3A33 33 weeks gestation of pregnancy: Secondary | ICD-10-CM

## 2018-05-16 DIAGNOSIS — Z148 Genetic carrier of other disease: Secondary | ICD-10-CM

## 2018-05-16 DIAGNOSIS — Z3490 Encounter for supervision of normal pregnancy, unspecified, unspecified trimester: Secondary | ICD-10-CM

## 2018-05-16 MED ORDER — SERTRALINE HCL 50 MG PO TABS: 50.0000 mg | ORAL_TABLET | Freq: Every day | ORAL | 2 refills | Status: DC

## 2018-05-16 NOTE — Progress Notes (Signed)
ROB Thinks she is depressed/[place] given

## 2018-05-16 NOTE — Progress Notes (Signed)
Routine Prenatal Care Visit  Subjective  Mary Curry is a 27 y.o. G3P0020 at [redacted]w[redacted]d being seen today for ongoing prenatal care.  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  ----------------------------------------------------------------------------------- Patient reports no complaints.  Patient does reports she has felt depressed in the past week. No exacerbating or situational factors identified other than increasing discomforts of pregnancy.  Contractions: Irregular. Vag. Bleeding: None.  Movement: Present. Denies leaking of fluid.  ----------------------------------------------------------------------------------- The following portions of the patient's history were reviewed and updated as appropriate: allergies, current medications, past family history, past medical history, past social history, past surgical history and problem list. Problem list updated.   Objective  Blood pressure 108/64, weight 194 lb (88 kg), last menstrual period 09/21/2017. Pregravid weight 156 lb (70.8 kg) Total Weight Gain 38 lb (17.2 kg) Urinalysis:      Fetal Status: Fetal Heart Rate (bpm): 145 Fundal Height: 34 cm Movement: Present     General:  Alert, oriented and cooperative. Patient is in no acute distress.  Skin: Skin is warm and dry. No rash noted.   Cardiovascular: Normal heart rate noted  Respiratory: Normal respiratory effort, no problems with respiration noted  Abdomen: Soft, gravid, appropriate for gestational age. Pain/Pressure: Absent     Pelvic:  Cervical exam deferred        Extremities: Normal range of motion.     ental Status: Normal mood and affect. Normal behavior. Normal judgment and thought content.     Assessment   27 y.o. 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 He states he has already been tested and is hgb AA.       Supervision of low-risk pregnancy 11/08/2017 by Conard Novak, MD No   Overview Addendum 05/02/2018  3:00 PM by Vena Austria, MD    Clinic [facility]atal [facility]  Dating LMP=6 week [place] Blood type: AB/Positive/-- (04/10 1518)   Genetic Screen Declined trisomy testing Inheritest negative Antibody:Negative (04/10 1518)  Anatomic [place] Female, incomplete [X]  complete 24 weeks Rubella: 13.70 (04/10 1518) Varicella: Immune  GTT 99 RPR: Non Reactive (04/10 1518)   Rhogam N/A HBsAg: Negative (04/10 1518)   TDaP vaccine                       Flu Shot:05/02/18 HIV: Non Reactive (04/10 1518)   Baby Food                                GBS:   Contraception  Pap: 11/08/17 NIL  CBB     CS/VBAC    Support Person                Gestational age appropriate obstetric precautions including but not limited to vaginal bleeding, contractions, leaking of fluid and fetal movement were reviewed in detail with the patient.   -  anxiety and depression, EPDS of 15, start Zoloft 50mg     Return in about 1 week (around 05/23/2018) for ROB.  Vena Austria, MD, Evern Core [facility], [facility] 05/16/2018, 3:07 PM

## 2018-05-17 ENCOUNTER — Inpatient Hospital Stay (HOSPITAL_COMMUNITY)
Admission: AD | Admit: 2018-05-17 | Discharge: 2018-05-17 | Disposition: A | Discharge status: Home / Self Care | Payer: Medicaid Other | Source: Ambulatory Visit | Attending: Obstetrics and Gynecology | Admitting: Obstetrics and Gynecology

## 2018-05-17 ENCOUNTER — Encounter (HOSPITAL_COMMUNITY): Payer: Self-pay | Admitting: *Deleted

## 2018-05-17 DIAGNOSIS — N760 Acute vaginitis: Secondary | ICD-10-CM

## 2018-05-17 DIAGNOSIS — Z3A34 34 weeks gestation of pregnancy: Secondary | ICD-10-CM | POA: Diagnosis not present

## 2018-05-17 DIAGNOSIS — B9689 Other specified bacterial agents as the cause of diseases classified elsewhere: Secondary | ICD-10-CM | POA: Diagnosis not present

## 2018-05-17 DIAGNOSIS — B373 Candidiasis of vulva and vagina: Secondary | ICD-10-CM | POA: Insufficient documentation

## 2018-05-17 DIAGNOSIS — O4703 False labor before 37 completed weeks of gestation, third trimester: Secondary | ICD-10-CM | POA: Insufficient documentation

## 2018-05-17 DIAGNOSIS — O23593 Infection of other part of genital tract in pregnancy, third trimester: Secondary | ICD-10-CM | POA: Diagnosis not present

## 2018-05-17 DIAGNOSIS — O479 False labor, unspecified: Secondary | ICD-10-CM

## 2018-05-17 DIAGNOSIS — O98813 Other maternal infectious and parasitic diseases complicating pregnancy, third trimester: Secondary | ICD-10-CM | POA: Insufficient documentation

## 2018-05-17 DIAGNOSIS — B3731 Acute candidiasis of vulva and vagina: Secondary | ICD-10-CM

## 2018-05-17 LAB — URINALYSIS, ROUTINE W REFLEX MICROSCOPIC
Bilirubin Urine: NEGATIVE
GLUCOSE, UA: NEGATIVE mg/dL
Ketones, ur: NEGATIVE mg/dL
Nitrite: NEGATIVE
PH: 6 (ref 5.0–8.0)
Protein, ur: NEGATIVE mg/dL
SPECIFIC GRAVITY, URINE: 1.017 (ref 1.005–1.030)

## 2018-05-17 LAB — WET PREP, GENITAL
Sperm: NONE SEEN
Trich, Wet Prep: NONE SEEN

## 2018-05-17 LAB — OB RESULTS CONSOLE GC/CHLAMYDIA: Gonorrhea: NEGATIVE

## 2018-05-17 MED ORDER — METRONIDAZOLE 500 MG PO TABS: 500.0000 mg | ORAL_TABLET | Freq: Two times a day (BID) | ORAL | 0 refills | Status: DC

## 2018-05-17 MED ORDER — TERCONAZOLE 0.4 % VA CREA: 1.0000 | TOPICAL_CREAM | Freq: Every day | VAGINAL | 0 refills | Status: DC

## 2018-05-17 NOTE — MAU Provider Note (Signed)
CC:  Chief Complaint  Patient presents with  . Abdominal Pain  . Back Pain  . Vaginal Pain     First Provider Initiated Contact with Patient 05/17/18 1913      HPI: Mary Curry is a 27 y.o. year old G35P0020 female at [redacted]w[redacted]d weeks gestation who presents to MAU reporting contractions minutes since last night and white vaginal discharge.  Associated Sx:  Vaginal bleeding: Denies Leaking of fluid: Denies Fetal movement: Normal  O:  Patient Vitals for the past 24 hrs:  BP Temp Temp src Pulse Resp SpO2 Height Weight  05/17/18 1816 106/73 97.6 F (36.4 C) Oral (!) 104 19 100 % 5\' 5"  (1.651 m) 87.5 kg    General: NAD Heart: Regular rate Lungs: Normal rate and effort Abd: Soft, NT, Gravid, S=D Pelvic: NEFG, negative pooling, negative blood.  Moderate amount of thick, white, malodorous discharge. Dilation: Closed Effacement (%): Thick Cervical Position: Posterior Station: Ballotable Presentation: Undeterminable Exam by:: Dorathy Kinsman CNM  EFM: 135, Moderate variability, 15 x 15 accelerations, no decelerations Toco: Uterine irritability and rare contractions  Orders Placed This Encounter  Procedures  . Wet prep, genital  . Culture, OB Urine  . Urinalysis, Routine w reflex microscopic  . Discharge patient   Meds ordered this encounter  Medications  . metroNIDAZOLE (FLAGYL) 500 MG tablet    Sig: Take 1 tablet (500 mg total) by mouth 2 (two) times daily.    Dispense:  14 tablet    Refill:  0    Order Specific Question:   Supervising Provider    Answer:   Levie Heritage [[id number]]  . terconazole (TERAZOL 7) 0.4 % vaginal cream    Sig: Place 1 applicator vaginally at bedtime.    Dispense:  45 g    Refill:  0    Order Specific Question:   Supervising Provider    Answer:   Levie Heritage [[id number]]    A: [redacted]w[redacted]d week IUP FHR reactive  1. Braxton Hicks contractions   2. BV (bacterial vaginosis)   3. Vaginal yeast infection      P: Discharge home in stable  condition. Preterm labor precautions and fetal kick counts. Follow-up as scheduled for prenatal visit or sooner as needed if symptoms worsen. Return to maternity admissions as needed if symptoms worsen.  Katrinka Blazing, [place], CNM 05/17/2018 8:35 PM  3

## 2018-05-17 NOTE — Discharge Instructions (Signed)
Braxton Hicks Contractions °Contractions of the uterus can occur throughout pregnancy, but they are not always a sign that you are in labor. You may have practice contractions called Braxton Hicks contractions. These false labor contractions are sometimes confused with true labor. °What are Braxton Hicks contractions? °Braxton Hicks contractions are tightening movements that occur in the muscles of the uterus before labor. Unlike true labor contractions, these contractions do not result in opening (dilation) and thinning of the cervix. Toward the end of pregnancy (32-34 weeks), Braxton Hicks contractions can happen more often and may become stronger. These contractions are sometimes difficult to tell apart from true labor because they can be very uncomfortable. You should not feel embarrassed if you go to the hospital with false labor. °Sometimes, the only way to tell if you are in true labor is for your health care provider to look for changes in the cervix. The health care provider will do a physical exam and may monitor your contractions. If you are not in true labor, the exam should show that your cervix is not dilating and your water has not broken. °If there are other health problems associated with your pregnancy, it is completely safe for you to be sent home with false labor. You may continue to have Braxton Hicks contractions until you go into true labor. °How to tell the difference between true labor and false labor °True labor °· Contractions last 30-70 seconds. °· Contractions become very regular. °· Discomfort is usually felt in the top of the uterus, and it spreads to the lower abdomen and low back. °· Contractions do not go away with walking. °· Contractions usually become more intense and increase in frequency. °· The cervix dilates and gets thinner. °False labor °· Contractions are usually shorter and not as strong as true labor contractions. °· Contractions are usually irregular. °· Contractions  are often felt in the front of the lower abdomen and in the groin. °· Contractions may go away when you walk around or change positions while lying down. °· Contractions get weaker and are shorter-lasting as time goes on. °· The cervix usually does not dilate or become thin. °Follow these instructions at home: °· Take over-the-counter and prescription medicines only as told by your health care provider. °· Keep up with your usual exercises and follow other instructions from your health care provider. °· Eat and drink lightly if you think you are going into labor. °· If Braxton Hicks contractions are making you uncomfortable: °? Change your position from lying down or resting to walking, or change from walking to resting. °? Sit and rest in a tub of warm water. °? Drink enough fluid to keep your urine pale yellow. Dehydration may cause these contractions. °? Do slow and deep breathing several times an hour. °· Keep all follow-up prenatal visits as told by your health care provider. This is important. °Contact a health care provider if: °· You have a fever. °· You have continuous pain in your abdomen. °Get help right away if: °· Your contractions become stronger, more regular, and closer together. °· You have fluid leaking or gushing from your vagina. °· You pass blood-tinged mucus (bloody show). °· You have bleeding from your vagina. °· You have low back pain that you never had before. °· You feel your baby’s head pushing down and causing pelvic pressure. °· Your baby is not moving inside you as much as it used to. °Summary °· Contractions that occur before labor are called Braxton   Hicks contractions, false labor, or practice contractions. °· Braxton Hicks contractions are usually shorter, weaker, farther apart, and less regular than true labor contractions. True labor contractions usually become progressively stronger and regular and they become more frequent. °· Manage discomfort from Braxton Hicks contractions by  changing position, resting in a warm bath, drinking plenty of water, or practicing deep breathing. °This information is not intended to replace advice given to you by your health care provider. Make sure you discuss any questions you have with your health care provider. °Document Released: 12/01/2016 Document Revised: 12/01/2016 Document Reviewed: 12/01/2016 °Elsevier Interactive Patient Education © 2018 [employer] ° ° °Bacterial Vaginosis °Bacterial vaginosis is a vaginal infection that occurs when the normal balance of bacteria in the vagina is disrupted. It results from an overgrowth of certain bacteria. This is the most common vaginal infection among women ages 15-44. °Because bacterial vaginosis increases your risk for STIs (sexually transmitted infections), getting treated can help reduce your risk for chlamydia, gonorrhea, herpes, and HIV (human immunodeficiency virus). Treatment is also important for preventing complications in pregnant women, because this condition can cause an early (premature) delivery. °What are the causes? °This condition is caused by an increase in harmful bacteria that are normally present in small amounts in the vagina. However, the reason that the condition develops is not fully understood. °What increases the risk? °The following factors may make you more likely to develop this condition: °· Having a new sexual partner or multiple sexual partners. °· Having unprotected sex. °· Douching. °· Having an intrauterine device (IUD). °· Smoking. °· Drug and alcohol abuse. °· Taking certain antibiotic medicines. °· Being pregnant. ° °You cannot get bacterial vaginosis from toilet seats, bedding, swimming pools, or contact with objects around you. °What are the signs or symptoms? °Symptoms of this condition include: °· Grey or white vaginal discharge. The discharge can also be watery or foamy. °· A fish-like odor with discharge, especially after sexual intercourse or during  menstruation. °· Itching in and around the vagina. °· Burning or pain with urination. ° °Some women with bacterial vaginosis have no signs or symptoms. °How is this diagnosed? °This condition is diagnosed based on: °· Your medical history. °· A physical exam of the vagina. °· Testing a sample of vaginal fluid under a microscope to look for a large amount of bad bacteria or abnormal cells. Your health care provider may use a cotton swab or a small wooden spatula to collect the sample. ° °How is this treated? °This condition is treated with antibiotics. These may be given as a pill, a vaginal cream, or a medicine that is put into the vagina (suppository). If the condition comes back after treatment, a second round of antibiotics may be needed. °Follow these instructions at home: °Medicines °· Take over-the-counter and prescription medicines only as told by your health care provider. °· Take or use your antibiotic as told by your health care provider. Do not stop taking or using the antibiotic even if you start to feel better. °General instructions °· If you have a female sexual partner, tell her that you have a vaginal infection. She should see her health care provider and be treated if she has symptoms. If you have a female sexual partner, he does not need treatment. °· During treatment: °? Avoid sexual activity until you finish treatment. °? Do not douche. °? Avoid alcohol as directed by your health care provider. °? Avoid breastfeeding as directed by your health care provider. °·   Drink enough water and fluids to keep your urine clear or pale yellow. °· Keep the area around your vagina and rectum clean. °? Wash the area daily with warm water. °? Wipe yourself from front to back after using the toilet. °· Keep all follow-up visits as told by your health care provider. This is important. °How is this prevented? °· Do not douche. °· Wash the outside of your vagina with warm water only. °· Use protection when having sex.  This includes latex condoms and dental dams. °· Limit how many sexual partners you have. To help prevent bacterial vaginosis, it is best to have sex with just one partner (monogamous). °· Make sure you and your sexual partner are tested for STIs. °· Wear cotton or cotton-lined underwear. °· Avoid wearing tight pants and pantyhose, especially during summer. °· Limit the amount of alcohol that you drink. °· Do not use any products that contain nicotine or tobacco, such as cigarettes and e-cigarettes. If you need help quitting, ask your health care provider. °· Do not use illegal drugs. °Where to find more information: °· Centers for Disease Control and Prevention: www.cdc.gov/std °· American Sexual Health Association (ASHA): www.ashastd.org °· U.S. Department of Health and Human Services, Office on Women's Health: www.womenshealth.gov/ or https://www.womenshealth.gov/a-z-topics/bacterial-vaginosis °Contact a health care provider if: °· Your symptoms do not improve, even after treatment. °· You have more discharge or pain when urinating. °· You have a fever. °· You have pain in your abdomen. °· You have pain during sex. °· You have vaginal bleeding between periods. °Summary °· Bacterial vaginosis is a vaginal infection that occurs when the normal balance of bacteria in the vagina is disrupted. °· Because bacterial vaginosis increases your risk for STIs (sexually transmitted infections), getting treated can help reduce your risk for chlamydia, gonorrhea, herpes, and HIV (human immunodeficiency virus). Treatment is also important for preventing complications in pregnant women, because the condition can cause an early (premature) delivery. °· This condition is treated with antibiotic medicines. These may be given as a pill, a vaginal cream, or a medicine that is put into the vagina (suppository). °This information is not intended to replace advice given to you by your health care provider. Make sure you discuss any  questions you have with your health care provider. °Document Released: 07/18/2005 Document Revised: 11/21/2016 Document Reviewed: 04/02/2016 °Elsevier Interactive Patient Education © 2018 Elsevier Inc. ° °

## 2018-05-17 NOTE — MAU Note (Signed)
Pt reports back pain and vaginal pain and pressure since yesterday.

## 2018-05-18 LAB — GC/CHLAMYDIA PROBE AMP (~~LOC~~) NOT AT ARMC
Chlamydia: NEGATIVE
NEISSERIA GONORRHEA: NEGATIVE

## 2018-05-19 LAB — CULTURE, OB URINE: Special Requests: NORMAL

## 2018-05-30 ENCOUNTER — Ambulatory Visit (INDEPENDENT_AMBULATORY_CARE_PROVIDER_SITE_OTHER): Payer: Medicaid Other | Admitting: Obstetrics and Gynecology

## 2018-05-30 VITALS — BP 110/72 | Wt 200.0 lb

## 2018-05-30 DIAGNOSIS — Z3A35 35 weeks gestation of pregnancy: Secondary | ICD-10-CM

## 2018-05-30 DIAGNOSIS — O99013 Anemia complicating pregnancy, third trimester: Secondary | ICD-10-CM

## 2018-05-30 DIAGNOSIS — Z3493 Encounter for supervision of normal pregnancy, unspecified, third trimester: Secondary | ICD-10-CM

## 2018-05-30 DIAGNOSIS — Z3685 Encounter for antenatal screening for Streptococcus B: Secondary | ICD-10-CM

## 2018-05-30 DIAGNOSIS — Z148 Genetic carrier of other disease: Secondary | ICD-10-CM

## 2018-05-30 LAB — POCT URINALYSIS DIPSTICK OB
Glucose, UA: NEGATIVE
PROTEIN: NEGATIVE

## 2018-05-30 NOTE — Progress Notes (Signed)
ROB BV/discharge w/odor GBS

## 2018-05-30 NOTE — Progress Notes (Signed)
Routine Prenatal Care Visit  Subjective  Mary Curry is a 27 y.o. G3P0020 at [redacted]w[redacted]d being seen today for ongoing prenatal care.  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  ----------------------------------------------------------------------------------- Patient reports no complaints.   Contractions: Irregular. Vag. Bleeding: None.  Movement: Present. Denies leaking of fluid.  ----------------------------------------------------------------------------------- The following portions of the patient's history were reviewed and updated as appropriate: allergies, current medications, past family history, past medical history, past social history, past surgical history and problem list. Problem list updated.   Objective  Blood pressure 110/72, weight 200 lb (90.7 kg), last menstrual period 09/21/2017. Pregravid weight 156 lb (70.8 kg) Total Weight Gain 44 lb (20 kg) Urinalysis:      Fetal Status: Fetal Heart Rate (bpm): 140 Fundal Height: 36 cm Movement: Present  Presentation: Vertex  General:  Alert, oriented and cooperative. Patient is in no acute distress.  Skin: Skin is warm and dry. No rash noted.   Cardiovascular: Normal heart rate noted  Respiratory: Normal respiratory effort, no problems with respiration noted  Abdomen: Soft, gravid, appropriate for gestational age. Pain/Pressure: Absent     Pelvic:  Cervical exam performed Dilation: Closed    Narrow pelvis  Extremities: Normal range of motion.     ental Status: Normal mood and affect. Normal behavior. Normal judgment and thought content.     Assessment   27 y.o. 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 He states he has already been tested and is hgb AA.       Supervision of low-risk pregnancy 11/08/2017 by Conard Novak, MD No   Overview Addendum 05/02/2018  3:00 PM by Vena Austria, MD    [facility] Prenatal Labs  Dating LMP=6 week [place] Blood type: AB/Positive/-- (04/10 1518)   Genetic Screen Declined trisomy testing Inheritest negative Antibody:Negative (04/10 1518)  Anatomic [place] Female, incomplete [X]  complete 24 weeks Rubella: 13.70 (04/10 1518) Varicella: Immune  GTT 99 RPR: Non Reactive (04/10 1518)   Rhogam N/A HBsAg: Negative (04/10 1518)   TDaP vaccine                       Flu Shot:05/02/18 HIV: Non Reactive (04/10 1518)   Baby Food                                GBS:   Contraception  Pap: 11/08/17 NIL  CBB     CS/VBAC    Support Person                Gestational age appropriate obstetric precautions including but not limited to vaginal bleeding, contractions, leaking of fluid and fetal movement were reviewed in detail with the patient.    - has not started Zoloft states feels fine - was recently treated for BV and canidda at [facility] - GBS  collected  Return in about 1 week (around 06/06/2018) for ROB.  Vena Austria, MD, [facility], [facility] 05/30/2018, 10:34 PM

## 2018-06-01 ENCOUNTER — Encounter: Payer: Self-pay | Admitting: Obstetrics and Gynecology

## 2018-06-01 DIAGNOSIS — B951 Streptococcus, group B, as the cause of diseases classified elsewhere: Secondary | ICD-10-CM | POA: Insufficient documentation

## 2018-06-01 LAB — STREP GP B NAA: STREP GROUP B AG: POSITIVE — AB

## 2018-06-08 ENCOUNTER — Ambulatory Visit (INDEPENDENT_AMBULATORY_CARE_PROVIDER_SITE_OTHER): Payer: Medicaid Other | Admitting: Obstetrics and Gynecology

## 2018-06-08 VITALS — BP 106/50 | Wt 204.0 lb

## 2018-06-08 DIAGNOSIS — O99013 Anemia complicating pregnancy, third trimester: Secondary | ICD-10-CM | POA: Diagnosis not present

## 2018-06-08 DIAGNOSIS — O2603 Excessive weight gain in pregnancy, third trimester: Secondary | ICD-10-CM

## 2018-06-08 DIAGNOSIS — B951 Streptococcus, group B, as the cause of diseases classified elsewhere: Secondary | ICD-10-CM

## 2018-06-08 DIAGNOSIS — Z148 Genetic carrier of other disease: Secondary | ICD-10-CM

## 2018-06-08 DIAGNOSIS — Z3A37 37 weeks gestation of pregnancy: Secondary | ICD-10-CM

## 2018-06-08 DIAGNOSIS — Z3493 Encounter for supervision of normal pregnancy, unspecified, third trimester: Secondary | ICD-10-CM

## 2018-06-08 LAB — POCT URINALYSIS DIPSTICK OB
Glucose, UA: NEGATIVE
POC,PROTEIN,UA: NEGATIVE

## 2018-06-08 NOTE — Progress Notes (Signed)
ROB

## 2018-06-10 IMAGING — US US OB TRANSVAGINAL
1 series · 13 of 28 positions shown · non-contrast
Comparison: None.

CLINICAL DATA: 26-year-old female reportedly 9 weeks pregnant with
vaginal bleeding over the past 2 weeks. The quantitative beta HCG is
48,947

EXAM:
OBSTETRIC <14 WK US AND TRANSVAGINAL OB US
TECHNIQUE: Both transabdominal and transvaginal ultrasound examinations were
performed for complete evaluation of the gestation as well as the
maternal uterus, adnexal regions, and pelvic cul-de-sac.
Transvaginal technique was performed to assess early pregnancy.

[Series 1: us ob transvaginal · 0.09mm/px · 79 acquisitions, 13 frames shown]
[im 3/79]
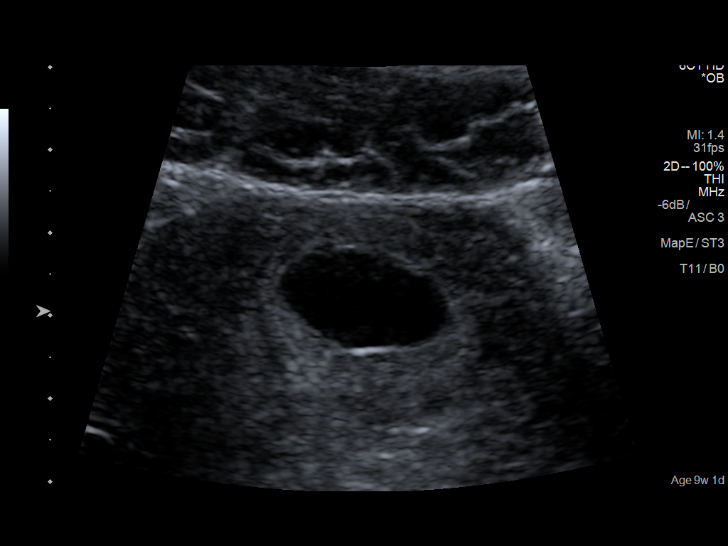
[im 9/79]
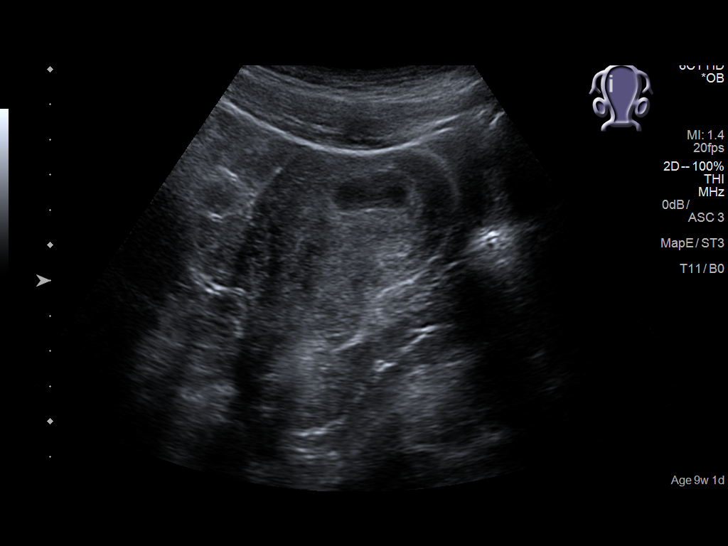
[im 15/79]
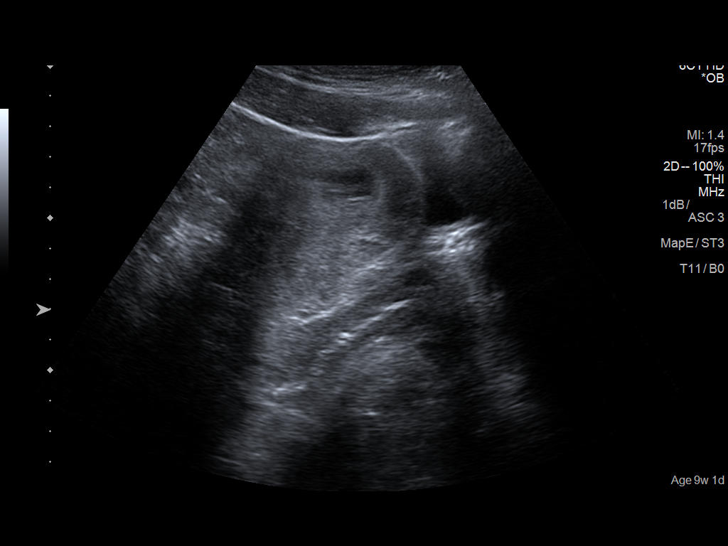
[im 21/79]
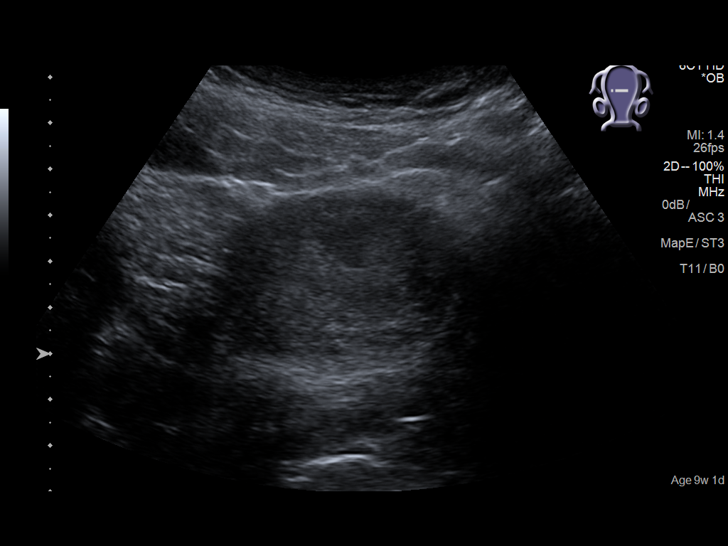
[im 27/79]
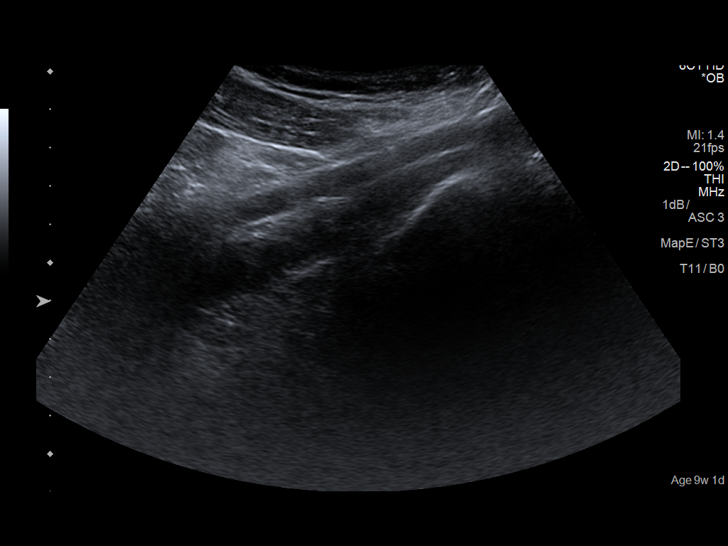
[im 32/79]
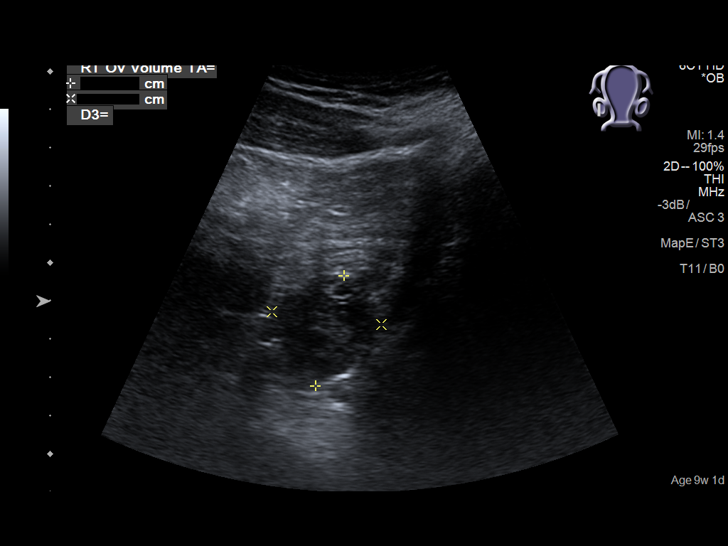
[im 41/79]
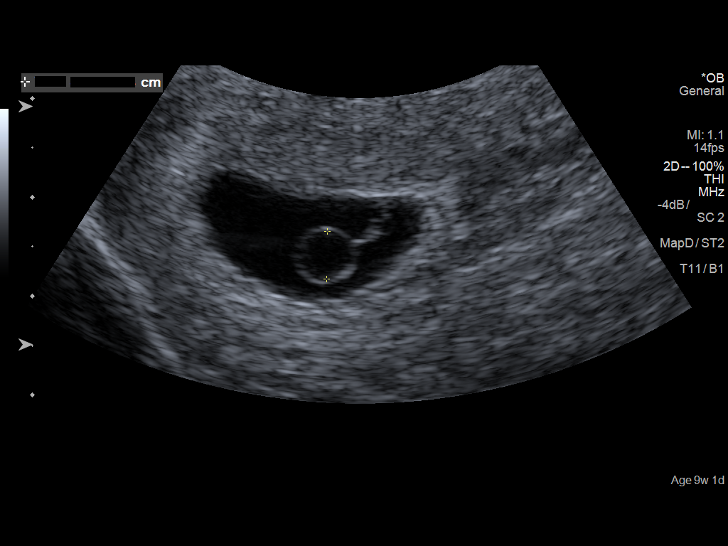
[im 47/79]
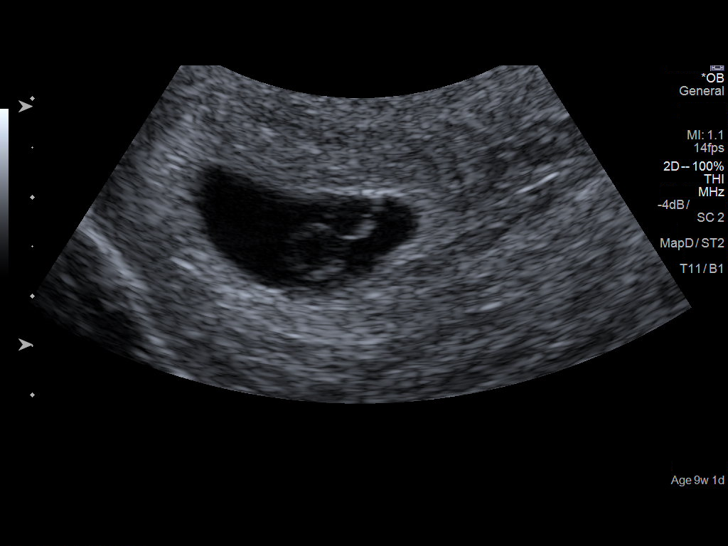
[im 53/79]
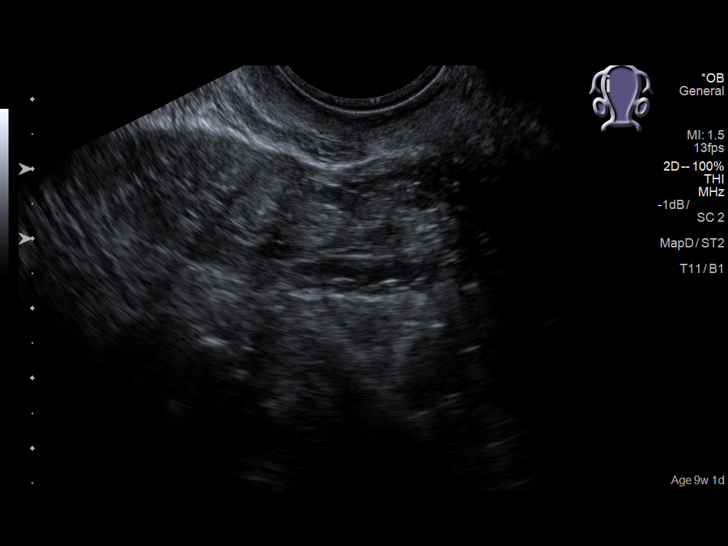
[im 58/79]
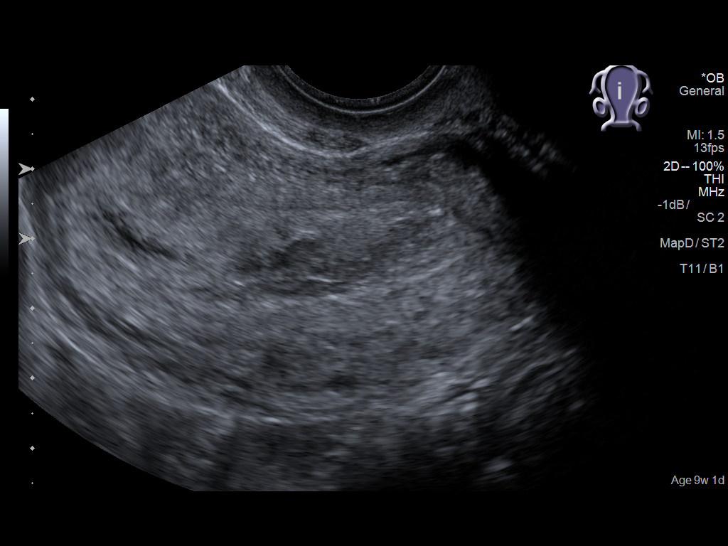
[im 64/79]
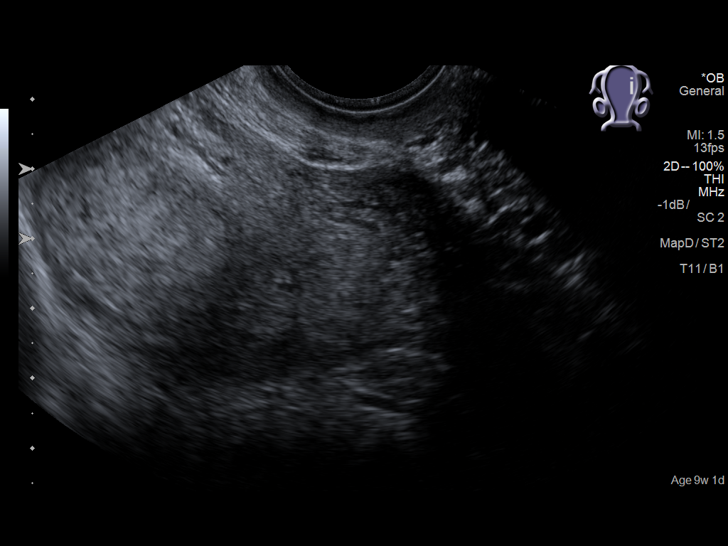
[im 70/79]
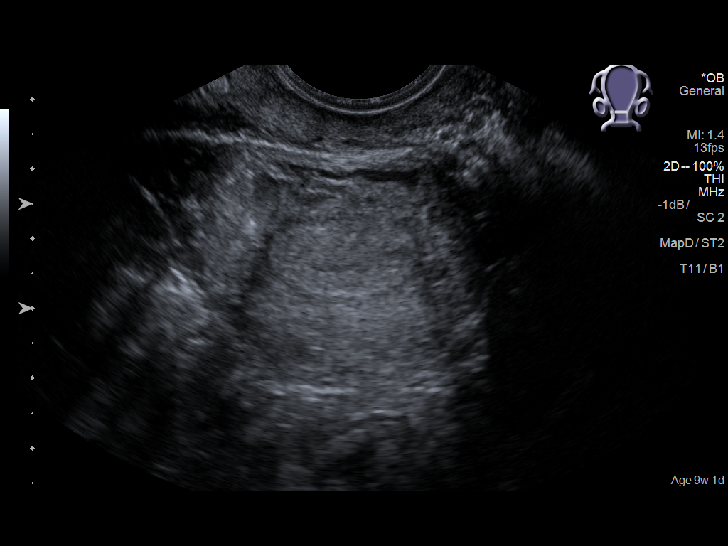
[im 76/79]
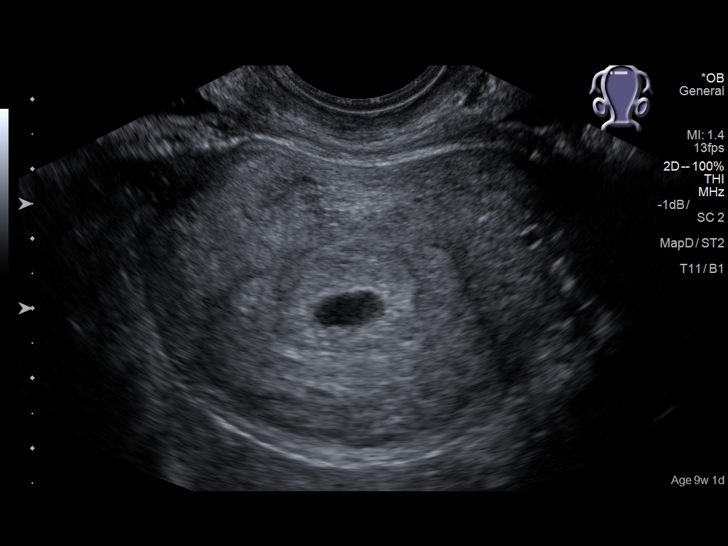

[13 of 28 positions shown; findings below may reference images not displayed]

FINDINGS: Intrauterine gestational sac: Single but within the lower uterine
segment. The contour of the gestational sac appears slightly
irregular.

Yolk sac:  Visualized.

Embryo:  Visualized.

Cardiac Activity: Not Visualized.

CRL:  3  mm   5 w   6 d

Subchorionic hemorrhage:  None visualized.

Maternal uterus/adnexae: The left ovary could not be visualized. The
right ovary is normal in finding. No free fluid.
IMPRESSION: 1. A single intrauterine gestational sac is identified within the
lower uterine segment. The contour the gestational sac appears
slightly irregular and no cardiac activity is identified within the
fetal pole. By crown-rump length, the estimated gestational age is 5
weeks 6 days which is not congruent with the reported gestational
age of 9 weeks 1 day. Findings are highly suspicious for failed
intrauterine pregnancy.

## 2018-06-12 ENCOUNTER — Other Ambulatory Visit: Payer: Self-pay

## 2018-06-12 ENCOUNTER — Encounter (HOSPITAL_COMMUNITY): Payer: Self-pay

## 2018-06-12 ENCOUNTER — Inpatient Hospital Stay (HOSPITAL_COMMUNITY)
Admission: AD | Admit: 2018-06-12 | Discharge: 2018-06-14 | DRG: 807 | Disposition: A | Discharge status: Home / Self Care | Payer: Medicaid Other | Attending: Family Medicine | Admitting: Family Medicine

## 2018-06-12 DIAGNOSIS — O4292 Full-term premature rupture of membranes, unspecified as to length of time between rupture and onset of labor: Principal | ICD-10-CM | POA: Diagnosis present

## 2018-06-12 DIAGNOSIS — O99824 Streptococcus B carrier state complicating childbirth: Secondary | ICD-10-CM | POA: Diagnosis present

## 2018-06-12 DIAGNOSIS — Z3A37 37 weeks gestation of pregnancy: Secondary | ICD-10-CM

## 2018-06-12 DIAGNOSIS — O9902 Anemia complicating childbirth: Secondary | ICD-10-CM | POA: Diagnosis present

## 2018-06-12 DIAGNOSIS — D649 Anemia, unspecified: Secondary | ICD-10-CM | POA: Diagnosis present

## 2018-06-12 DIAGNOSIS — Z3490 Encounter for supervision of normal pregnancy, unspecified, unspecified trimester: Secondary | ICD-10-CM | POA: Diagnosis present

## 2018-06-12 DIAGNOSIS — Z148 Genetic carrier of other disease: Secondary | ICD-10-CM

## 2018-06-12 DIAGNOSIS — B951 Streptococcus, group B, as the cause of diseases classified elsewhere: Secondary | ICD-10-CM | POA: Diagnosis present

## 2018-06-12 DIAGNOSIS — O4202 Full-term premature rupture of membranes, onset of labor within 24 hours of rupture: Secondary | ICD-10-CM

## 2018-06-12 DIAGNOSIS — Z3493 Encounter for supervision of normal pregnancy, unspecified, third trimester: Secondary | ICD-10-CM

## 2018-06-12 DIAGNOSIS — O99013 Anemia complicating pregnancy, third trimester: Secondary | ICD-10-CM

## 2018-06-12 DIAGNOSIS — O1415 Severe pre-eclampsia, complicating the puerperium: Secondary | ICD-10-CM | POA: Diagnosis not present

## 2018-06-12 HISTORY — DX: Anemia, unspecified: D64.9

## 2018-06-12 HISTORY — DX: Complete or unspecified spontaneous abortion without complication: O03.9

## 2018-06-12 LAB — CBC
HCT: 28.3 % — ABNORMAL LOW (ref 36.0–46.0)
HEMOGLOBIN: 10.2 g/dL — AB (ref 12.0–15.0)
MCH: 29 pg (ref 26.0–34.0)
MCHC: 36 g/dL (ref 30.0–36.0)
MCV: 80.4 fL (ref 80.0–100.0)
NRBC: 0 % (ref 0.0–0.2)
PLATELETS: 173 10*3/uL (ref 150–400)
RBC: 3.52 MIL/uL — ABNORMAL LOW (ref 3.87–5.11)
RDW: 14.3 % (ref 11.5–15.5)
WBC: 12.2 10*3/uL — ABNORMAL HIGH (ref 4.0–10.5)

## 2018-06-12 LAB — POCT FERN TEST: POCT Fern Test: POSITIVE

## 2018-06-12 LAB — TYPE AND SCREEN
ABO/RH(D): AB POS
ANTIBODY SCREEN: NEGATIVE

## 2018-06-12 LAB — RPR: RPR Ser Ql: NONREACTIVE

## 2018-06-12 MED ORDER — TETANUS-DIPHTH-ACELL PERTUSSIS 5-2.5-18.5 LF-MCG/0.5 IM SUSP: 0.5000 mL | Freq: Once | INTRAMUSCULAR | Status: DC

## 2018-06-12 MED ORDER — MISOPROSTOL 50MCG HALF TABLET: 50.0000 ug | ORAL_TABLET | ORAL | Status: DC

## 2018-06-12 MED ORDER — LIDOCAINE HCL (PF) 1 % IJ SOLN
30.0000 mL | INTRAMUSCULAR | Status: DC | PRN
  Administered 2018-06-12: 30 mL via SUBCUTANEOUS
  Filled 2018-06-12: qty 30

## 2018-06-12 MED ORDER — OXYCODONE-ACETAMINOPHEN 5-325 MG PO TABS: 2.0000 | ORAL_TABLET | ORAL | Status: DC | PRN

## 2018-06-12 MED ORDER — WITCH HAZEL-GLYCERIN EX PADS: 1.0000 "application " | MEDICATED_PAD | CUTANEOUS | Status: DC | PRN

## 2018-06-12 MED ORDER — IBUPROFEN 600 MG PO TABS
600.0000 mg | ORAL_TABLET | Freq: Four times a day (QID) | ORAL | Status: DC
  Administered 2018-06-12 – 2018-06-14 (×7): 600 mg via ORAL
  Filled 2018-06-12 (×7): qty 1

## 2018-06-12 MED ORDER — ONDANSETRON HCL 4 MG PO TABS: 4.0000 mg | ORAL_TABLET | ORAL | Status: DC | PRN

## 2018-06-12 MED ORDER — PRENATAL MULTIVITAMIN CH
1.0000 | ORAL_TABLET | Freq: Every day | ORAL | Status: DC
  Administered 2018-06-13 – 2018-06-14 (×2): 1 via ORAL
  Filled 2018-06-12 (×2): qty 1

## 2018-06-12 MED ORDER — MISOPROSTOL 50MCG HALF TABLET
50.0000 ug | ORAL_TABLET | ORAL | Status: DC | PRN
  Administered 2018-06-12 (×2): 50 ug via ORAL
  Filled 2018-06-12 (×3): qty 1

## 2018-06-12 MED ORDER — BENZOCAINE-MENTHOL 20-0.5 % EX AERO: 1.0000 "application " | INHALATION_SPRAY | CUTANEOUS | Status: DC | PRN

## 2018-06-12 MED ORDER — DIPHENHYDRAMINE HCL 25 MG PO CAPS: 25.0000 mg | ORAL_CAPSULE | Freq: Four times a day (QID) | ORAL | Status: DC | PRN

## 2018-06-12 MED ORDER — ONDANSETRON HCL 4 MG/2ML IJ SOLN: 4.0000 mg | Freq: Four times a day (QID) | INTRAMUSCULAR | Status: DC | PRN

## 2018-06-12 MED ORDER — TERBUTALINE SULFATE 1 MG/ML IJ SOLN
0.2500 mg | Freq: Once | INTRAMUSCULAR | Status: DC | PRN
  Filled 2018-06-12: qty 1

## 2018-06-12 MED ORDER — OXYCODONE-ACETAMINOPHEN 5-325 MG PO TABS: 1.0000 | ORAL_TABLET | ORAL | Status: DC | PRN

## 2018-06-12 MED ORDER — LACTATED RINGERS IV SOLN
INTRAVENOUS | Status: DC
  Administered 2018-06-12 (×3): via INTRAVENOUS

## 2018-06-12 MED ORDER — DIBUCAINE 1 % RE OINT: 1.0000 "application " | TOPICAL_OINTMENT | RECTAL | Status: DC | PRN

## 2018-06-12 MED ORDER — SODIUM CHLORIDE 0.9 % IV SOLN
5.0000 10*6.[IU] | Freq: Once | INTRAVENOUS | Status: AC
  Administered 2018-06-12: 5 10*6.[IU] via INTRAVENOUS
  Filled 2018-06-12: qty 5

## 2018-06-12 MED ORDER — SENNOSIDES-DOCUSATE SODIUM 8.6-50 MG PO TABS
2.0000 | ORAL_TABLET | ORAL | Status: DC
  Administered 2018-06-12 – 2018-06-13 (×2): 2 via ORAL
  Filled 2018-06-12 (×2): qty 2

## 2018-06-12 MED ORDER — FENTANYL CITRATE (PF) 100 MCG/2ML IJ SOLN
100.0000 ug | Freq: Once | INTRAMUSCULAR | Status: AC
  Administered 2018-06-12: 100 ug via INTRAVENOUS
  Filled 2018-06-12: qty 2

## 2018-06-12 MED ORDER — LACTATED RINGERS IV SOLN: 500.0000 mL | INTRAVENOUS | Status: DC | PRN

## 2018-06-12 MED ORDER — FENTANYL CITRATE (PF) 100 MCG/2ML IJ SOLN
100.0000 ug | INTRAMUSCULAR | Status: DC | PRN
  Administered 2018-06-12 (×3): 100 ug via INTRAVENOUS
  Filled 2018-06-12 (×3): qty 2

## 2018-06-12 MED ORDER — COCONUT OIL OIL: 1.0000 "application " | TOPICAL_OIL | Status: DC | PRN

## 2018-06-12 MED ORDER — ONDANSETRON HCL 4 MG/2ML IJ SOLN: 4.0000 mg | INTRAMUSCULAR | Status: DC | PRN

## 2018-06-12 MED ORDER — OXYTOCIN 40 UNITS IN LACTATED RINGERS INFUSION - SIMPLE MED
2.5000 [IU]/h | INTRAVENOUS | Status: DC
  Filled 2018-06-12 (×2): qty 1000

## 2018-06-12 MED ORDER — OXYTOCIN 40 UNITS IN LACTATED RINGERS INFUSION - SIMPLE MED
1.0000 m[IU]/min | INTRAVENOUS | Status: DC
  Administered 2018-06-12: 2 m[IU]/min via INTRAVENOUS

## 2018-06-12 MED ORDER — ACETAMINOPHEN 325 MG PO TABS: 650.0000 mg | ORAL_TABLET | ORAL | Status: DC | PRN

## 2018-06-12 MED ORDER — OXYTOCIN BOLUS FROM INFUSION
500.0000 mL | Freq: Once | INTRAVENOUS | Status: AC
  Administered 2018-06-12: 500 mL via INTRAVENOUS

## 2018-06-12 MED ORDER — SERTRALINE HCL 50 MG PO TABS
50.0000 mg | ORAL_TABLET | Freq: Every day | ORAL | Status: DC
  Filled 2018-06-12 (×2): qty 1

## 2018-06-12 MED ORDER — SIMETHICONE 80 MG PO CHEW: 80.0000 mg | CHEWABLE_TABLET | ORAL | Status: DC | PRN

## 2018-06-12 MED ORDER — TERBUTALINE SULFATE 1 MG/ML IJ SOLN: 0.2500 mg | Freq: Once | INTRAMUSCULAR | Status: DC | PRN

## 2018-06-12 MED ORDER — ZOLPIDEM TARTRATE 5 MG PO TABS: 5.0000 mg | ORAL_TABLET | Freq: Every evening | ORAL | Status: DC | PRN

## 2018-06-12 MED ORDER — FLEET ENEMA 7-19 GM/118ML RE ENEM: 1.0000 | ENEMA | RECTAL | Status: DC | PRN

## 2018-06-12 MED ORDER — PENICILLIN G 3 MILLION UNITS IVPB - SIMPLE MED
3.0000 10*6.[IU] | INTRAVENOUS | Status: DC
  Administered 2018-06-12 (×3): 3 10*6.[IU] via INTRAVENOUS
  Filled 2018-06-12 (×4): qty 100

## 2018-06-12 MED ORDER — SOD CITRATE-CITRIC ACID 500-334 MG/5ML PO SOLN: 30.0000 mL | ORAL | Status: DC | PRN

## 2018-06-12 NOTE — Progress Notes (Signed)
Mary PitchFanta Curry is a 27 y.o. G3P0020 at 8165w5d by LMP admitted for induction of labor due to PROM.  Subjective: Patient attempting to rest, complaining of pain with contractions, family at bedside.  Objective: BP 124/71   Pulse 86   Temp 97.8 F (36.6 C) (Oral)   Resp 18   Ht 5\' 5"  (1.651 m)   Wt 92.5 kg   LMP 09/21/2017 (Exact Date)   SpO2 100%   BMI 33.93 kg/m  I/O last 3 completed shifts: In: 647.7 [P.O.:60; I.V.:116.9; IV Piggyback:470.8] Out: -  Total I/O In: 321 [P.O.:60; I.V.:261] Out: -   FHT:  FHR: 125 bpm, variability: moderate,  accelerations:  Present,  decelerations:  Absent UC:   regular, every 3-4 minutes SVE:   Dilation: Closed Exam by:: Dr. Darin Curry  Labs: Lab Results  Component Value Date   WBC 12.2 (H) 06/12/2018   HGB 10.2 (L) 06/12/2018   HCT 28.3 (L) 06/12/2018   MCV 80.4 06/12/2018   PLT 173 06/12/2018    Assessment / Plan: IOL due to PROM  Discussed plan to stop pitocin and start cytotec. Patient and family at bedside verbalized understanding and agreed to plan.  Labor: Latent labor Preeclampsia:  no signs or symptoms of toxicity Fetal Wellbeing:  Category I Pain Control:  Labor support without medications, IV pain meds and Nitrous Oxide I/D:  n/a Anticipated MOD:  NSVD  Mary Curry 06/12/2018, 10:22 AM

## 2018-06-12 NOTE — H&P (Addendum)
LABOR AND DELIVERY ADMISSION HISTORY AND PHYSICAL NOTE  Mary Curry is a 27 y.o. female G16P0020 with IUP at [redacted]w[redacted]d by LMP presenting with PROM.   She reports positive fetal movement and leakage of fluid at 0200. No vaginal bleeding. Feeling contractions, more than 5 minutes apart.  Pregnancy has been uncomplicated.   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 Medical history non-contributory     Past Surgical History: Past Surgical History:  Procedure Laterality Date  . DILATION AND CURETTAGE OF UTERUS N/A 06/29/2017   Procedure: DILATATION AND CURETTAGE;  Surgeon: Vena Austria, MD;  Location: [facility] ORS;  Service: Gynecology;  Laterality: N/A;  . NO PAST SURGERIES      Obstetrical History: OB History    Gravida  3   Para  0   Term      Preterm      AB  2   Living  0     SAB  2   TAB      Ectopic      Multiple      Live Births              Social History: Social History   Socioeconomic History  . Marital status: Single    Spouse name: Not on file  . Number of children: Not on file  . Years of education: Not on file  . Highest education level: Not on file  Occupational History  . Not on file  Social Needs  . Financial resource strain: Not on file  . Food insecurity:    Worry: Not on file    Inability: Not on file  . Transportation needs:    Medical: Not on file    Non-medical: Not on file  Tobacco Use  . Smoking status: Never Smoker  . Smokeless tobacco: Never Used  Substance and Sexual Activity  . Alcohol use: No  . Drug use: No  . Sexual activity: Yes    Birth control/protection: None  Lifestyle  . Physical activity:    Days per week: Not on file    Minutes per session: Not on file  . Stress: Not on file  Relationships  .  Social connections:    Talks on phone: Not on file    Gets together: Not on file    Attends religious service: Not on file    Active member of club or organization: Not on file    Attends meetings of clubs or organizations: Not on file    Relationship status: Not on file  Other Topics Concern  . Not on file  Social History Narrative  . Not on file    Family History: Family History  Problem Relation Age of Onset  . Hypertension Mother     Allergies: No Known Allergies  Medications Prior to Admission  Medication Sig Dispense Refill Last Dose  . Fe Fum-Fe Poly-Vit C-Lactobac (FUSION) 65-65-25-30 MG CAPS Take 1 tablet by mouth daily. 30 capsule 11 06/11/2018 at Unknown time  . Prenat-FeCbn-FeAsp-Meth-FA-DHA (PRENATE MINI) 18-0.6-0.4-350 MG CAPS Take 1 tablet by mouth daily. 30 capsule 8 06/11/2018 at Unknown time  . sertraline (ZOLOFT) 50 MG tablet Take 1 tablet (50 mg total) by mouth daily. 30 tablet 2 06/11/2018  . terconazole (TERAZOL 7) 0.4 % vaginal cream Place 1 applicator vaginally at  bedtime. 45 g 0 06/11/2018     Review of Systems  All systems reviewed and negative except as stated in HPI  Physical Exam Blood pressure 122/66, pulse 80, temperature 97.6 F (36.4 C), resp. rate 18, height 5\' 5"  (1.651 m), weight 92.5 kg, last menstrual period 09/21/2017, SpO2 100 %. General appearance: alert, oriented, NAD Lungs: normal respiratory effort Heart: regular rate Abdomen: soft, non-tender; gravid, FH appropriate for GA Extremities: No calf swelling or tenderness Presentation: cephalic Fetal monitoring: Baseline FHR 130 bpm with moderate variability, +accelerations, -declerations Uterine activity: Uterine contractions every 7 minutes Dilation: Closed Exam by:: Katheren ShamsAmber Stovall RN  Prenatal labs: ABO, Rh: AB/Positive/-- (04/10 1518) Antibody: Negative (04/10 1518) Rubella: 13.70 (04/10 1518) RPR: Non Reactive (09/09 1457)  HBsAg: Negative (04/10 1518)  HIV: Non Reactive  (09/09 1457)  GC/Chlamydia: Negative GBS: Positive (10/30 1633)  2-hr GTT: Normal Genetic screening:  Declined trisomy testing; Inheritest negative Anatomy US: Normal  Prenatal Transfer Tool  Maternal Diabetes: No Genetic Screening: Declined trisomy testing; Inheritest negative Maternal Ultrasounds/Referrals: Normal Fetal Ultrasounds or other Referrals:  None Maternal Substance Abuse:  No Significant Maternal Medications:  None Significant Maternal Lab Results: None  Results for orders placed or performed during the hospital encounter of 06/12/18 (from the past 24 hour(s))  POCT fern test   Collection Time: 06/12/18  3:55 AM  Result Value Ref Range   POCT Fern Test Positive = ruptured amniotic membanes   CBC   Collection Time: 06/12/18  4:22 AM  Result Value Ref Range   WBC 12.2 (H) 4.0 - 10.5 K/uL   RBC 3.52 (L) 3.87 - 5.11 MIL/uL   Hemoglobin 10.2 (L) 12.0 - 15.0 g/dL   HCT 16.128.3 (L) 09.636.0 - 04.546.0 %   MCV 80.4 80.0 - 100.0 fL   MCH 29.0 26.0 - 34.0 pg   MCHC 36.0 30.0 - 36.0 g/dL   RDW 40.914.3 81.111.5 - 91.415.5 %   Platelets 173 150 - 400 K/uL   nRBC 0.0 0.0 - 0.2 %   Patient Active Problem List   Diagnosis Date Noted  . Positive GBS test 06/01/2018  . Anemia affecting pregnancy in third trimester 04/10/2018  . 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 Supervision of low-risk pregnancy 11/08/2017  . Miscarriage within last 12 months 10/30/2017    Assessment: Mary Curry is a 27 y.o. G3P0020 at 455w5d here for PROM.  #Labor:  -- Uterine contractions every 7 minutes -- Will start pitocin after patient finishes eating  #Pain: IV fentanyl q1hr -- would like to avoid an epidural  #FWB: Cephalic by US on admission -- Category 1 fetal tracing  #ID: GBS (+) -- giving penicillin  #MOF: Breast and bottle #MOC: Nexplanon #Circ: Outpatient circ  Mary Curry 06/12/2018, 5:16 AM  I interviewed and examined the patient in conjunction with the medical student. Burman NievesJulia  Abraham, MD Family Medicine Resident   OB FELLOW HISTORY AND PHYSICAL ATTESTATION  I have seen and examined this patient; I agree with above documentation in the resident's note.   Gwenevere AbbotNimeka Hong Moring, MD OB Fellow  06/12/2018, 6:34 AM

## 2018-06-12 NOTE — MAU Note (Signed)
Pt states around 0200 she had a gush of clear fluid. Pt denies pain or vaginal bleeding. Reports good fetal movement. Cervix has not been recently examined

## 2018-06-12 NOTE — Anesthesia Pain Management Evaluation Note (Signed)
  CRNA Pain Management Visit Note  Patient: Mary Curry, 27 y.o., female  "Hello I am a member of the anesthesia team at [facility]. We have an anesthesia team available at all times to provide care throughout the hospital, including epidural management and anesthesia for C-section. I don't know your plan for the delivery whether it a natural birth, water birth, IV sedation, nitrous supplementation, doula or epidural, but we want to meet your pain goals."   1.Was your pain managed to your expectations on prior hospitalizations?   Yes   2.What is your expectation for pain management during this hospitalization?     IV pain meds  3.How can we help you reach that goal? IV pain rx  Record the patient's initial score and the patient's pain goal.   Pain: 5  Pain Goal: 8 [facility] wants you to be able to say your pain was always managed very well.  Versie Fleener 06/12/2018

## 2018-06-12 NOTE — Progress Notes (Signed)
Patient ID: Mary Curry, female   DOB: 04/18/1991, 27 y.o.   MRN: [id number] Delivery Note Mary Curry is a 27 y.o. female G3P0020 with IUP at 439w5d admitted for PROM.  She progressed with cytotec augmentation to complete and pushed 10min to deliver. Cord clamping delayed by several minutes then clamped by CNM and cut by FOB.  At 7:47 PM a viable female was delivered via Vaginal, Spontaneous (Presentation: ROA).  APGAR: 8, 9; weight  .   Placenta status: spontaneous, intact.  Cord: 3-vessel without complications: .  Cord pH: n/a   Anesthesia: fentanyl, nitrous Episiotomy: None Lacerations: Labial Suture Repair: 4.0 monochryl Est. Blood Loss (mL):  300  Mom to postpartum.  Baby to Couplet care / Skin to Skin.  Bernerd LimboJamilla R Haris Baack 06/12/2018, 8:28 PM

## 2018-06-13 LAB — CBC
HCT: 26 % — ABNORMAL LOW (ref 36.0–46.0)
HEMOGLOBIN: 9.5 g/dL — AB (ref 12.0–15.0)
MCH: 29.2 pg (ref 26.0–34.0)
MCHC: 36.5 g/dL — ABNORMAL HIGH (ref 30.0–36.0)
MCV: 80 fL (ref 80.0–100.0)
NRBC: 0 % (ref 0.0–0.2)
Platelets: 179 10*3/uL (ref 150–400)
RBC: 3.25 MIL/uL — AB (ref 3.87–5.11)
RDW: 14.1 % (ref 11.5–15.5)
WBC: 20.5 10*3/uL — AB (ref 4.0–10.5)

## 2018-06-13 NOTE — Progress Notes (Signed)
POSTPARTUM PROGRESS NOTE  Post Partum Day 1  Subjective:  Mary Curry is a 27 y.o. [id number] s/p SVD at 2067w5d c/b PROM.  She reports she is doing well. No acute events overnight. She denies any problems with ambulating, voiding or po intake. Denies nausea or vomiting. Not passing gas. No BMs. Pain is well controlled. Pt reports worsened vaginal bleeding this morning compared to prior. She describes it as being similar to the "gushes" of blood she experienced following delivery with uterine massage.  Objective: Blood pressure (!) 117/58, pulse 83, temperature 98.6 F (37 C), temperature source Oral, resp. rate 16, height 5\' 5"  (1.651 m), weight 92.5 kg, last menstrual period 09/21/2017, SpO2 96 %, unknown if currently breastfeeding.  Physical Exam:  General: alert, cooperative and no distress Chest: no respiratory distress Heart:regular rate, distal pulses intact Abdomen: soft, nontender,  Uterine Fundus: firm, appropriately tender DVT Evaluation: No calf swelling or tenderness Extremities: No edema Skin: warm, dry  Recent Labs    06/12/18 0422 06/13/18 0539  HGB 10.2* 9.5*  HCT 28.3* 26.0*    Assessment/Plan: Mary Curry is a [age] y.o. [id number] s/p SVD at 6267w5d c/b PROM.  PPD#1 - Doing well -- Routine postpartum care  Anemia, asymptomatic:  -- Stable -- Given CBC this morning indicating Hgb 9.5, will plan to start iron supplementation tablets PO  -- CTM vaginal bleeding, and will reevaluate if it does not slow, worsens, or pt becomes symptomatic  Contraception: Nexplanon Feeding: Breast and bottle Dispo: Plan for discharge tomorrow   LOS: 1 day   Raul DelFrancie Jenkins, MS3 06/13/2018, 7:26 AM  OB FELLOW POSTPARTUM PROGRESS NOTE ATTESTATION  I have seen and examined this patient and agree with above documentation in the student's note.   Gwenevere AbbotNimeka Machele Deihl, MD OB Fellow  06/13/2018, 7:43 PM

## 2018-06-13 NOTE — Lactation Note (Signed)
This note was copied from a baby's chart. Lactation Consultation Note  Patient Name: Mary Curry AVWUJ'WToday's Date: 06/13/2018 Reason for consult: Follow-up assessment  Visited with P1 Mom of ET infant at 7415 hrs old.  Baby <6 lbs and down 2%.  Baby has had breastfeeding attempts and 3 bottle supplements of 2-7 ml Gerber formula.  Assisted with STS on Mom's chest, as it had been 3-4 hrs since last feeding.  Baby had a moderate emesis prior to placing her in laid back position.  Mom doing breast massage and using hand pump prior to latch.  Reviewed hand expression, and Mom wincing in pain.  Advised Mom to do this herself in order to gauge how strong to express.  A drop of colostrum noted and Mom got very excited.  Baby not rooting, nor is able to attain a deep areolar latch at this time.  Reassured Mom about normal newborn sleepiness in first day of life.  But due to baby being an ET, and <6 lbs, we would be more proactive with feeding plan.    As baby getting his bath, assisted Mom with first double pumping.  Reviewed importance of double pump to stimulate her milk supply.  Reviewed how to disassemble pump parts and wash in basin, rinse well, and basin to dry.  Feeding Plan- 1- Keep baby STS as much as possible 2- At least every 3 hrs, to awaken baby, and attempt to latch to breast 3- follow this with 5-10 ml of Neosure 22 cal formula+/EBM by paced bottle feeding 4- Pump both breasts on initiation setting and also use breast massage and hand expression to express as much colostrum as possible. 5- Ask for assistance prn  Mom aware of IP and OP lactation support available to her.  Encouraged her to call prn for assist.   Mom seems overwhelmed and LC not sure how well she is listening to instructions.  Wrote feeding plan on dry erase board.  Room full of visiting women, lots of talking in room.   Plan will need reinforcing. RN aware of plan.  Lactation brochure left in room.   [facility] [facility]  Lactation Tools Discussed/Used Tools: Pump;Bottle Breast pump type: Double-Electric Breast Pump;Manual WIC Program: Yes Pump Review: Setup, frequency, and cleaning;Milk Storage Initiated by:: Erby Pian Errol Ala RN IBCLC Date initiated:: 06/13/18   Consult Status Consult Status: Follow-up Date: 06/14/18 Follow-up type: In-patient    Mary Curry, Mary Curry E 06/13/2018, 10:54 AM

## 2018-06-13 NOTE — Progress Notes (Signed)
CSW acknowledges consult.  CSW attempted to meet with MOB, however MOB had several room guest.  CSW will attempt to visit with MOB at a later time.   Jenalyn Girdner Boyd-Gilyard, MSW, LCSW Clinical Social Work [phone]  

## 2018-06-14 LAB — BIRTH TISSUE RECOVERY COLLECTION (PLACENTA DONATION)

## 2018-06-14 MED ORDER — IBUPROFEN 600 MG PO TABS: 600.0000 mg | ORAL_TABLET | Freq: Four times a day (QID) | ORAL | 0 refills | Status: DC

## 2018-06-14 NOTE — Discharge Summary (Addendum)
Obstetrics Discharge Summary OB/GYN Faculty Practice   Patient Name: Mary Curry DOB: 13-Nov-1990 MRN: [id number]  Date of admission: 06/12/2018 Delivering MD: Edd Arbour R   Date of discharge: 06/14/2018  Admitting diagnosis: 37WKS WATER BROKE Intrauterine pregnancy: [redacted]w[redacted]d     Secondary diagnosis:   Principal Problem:   Full-term premature rupture of membranes Active Problems:   Supervision of low-risk pregnancy   Positive GBS test   Encounter for induction of labor  Additional problems:  . Hemoglobin C carrier      Discharge diagnosis: Term Pregnancy Delivered                                            Postpartum procedures: None  Complications: None  Hospital course: Mary Curry is a 27 y.o. [redacted]w[redacted]d who was admitted for PROM. Her pregnancy was uncomplicated. Her labor course was notable for treatment of GBS with penicillin and augmentation with cytotec and Pitocin. Delivery was complicated by labial laceration repaired with monocryl. Please see delivery/op note for additional details. Her postpartum course was uncomplicated. She was breastfeeding and supplementing with bottle. By day of discharge, she was passing flatus, urinating, eating and drinking without difficulty. Her pain was well-controlled, and she was discharged home with ibuprofen. She will follow-up in clinic in 4 weeks.   Physical exam  Vitals:   06/13/18 1445 06/13/18 2239 06/14/18 0045 06/14/18 0541  BP: 114/63 (!) 102/59 118/62 117/74  Pulse: 92 (!) 57 85 70  Resp: 16  20 18   Temp: 99 F (37.2 C) 98.3 F (36.8 C)  97.7 F (36.5 C)  TempSrc: Oral Oral  Other (Comment)  SpO2: 99% 99%  100%  Weight:      Height:       General: resting comfortably Lochia: appropriate Uterine Fundus: firm Incision: N/A DVT Evaluation: No evidence of DVT seen on physical exam. Negative Homan's sign. Labs: Lab Results  Component Value Date   WBC 20.5 (H) 06/13/2018   HGB 9.5 (L) 06/13/2018   HCT 26.0 (L)  06/13/2018   MCV 80.0 06/13/2018   PLT 179 06/13/2018   CMP Latest Ref Rng & Units 02/02/2018  Glucose 70 - 99 mg/dL 81  BUN 6 - 20 mg/dL 7  Creatinine 4.09 - 8.11 mg/dL 9.14  Sodium 782 - 956 mmol/L 137  Potassium 3.5 - 5.1 mmol/L 3.4(L)  Chloride 98 - 111 mmol/L 107  CO2 22 - 32 mmol/L 23  Calcium 8.9 - 10.3 mg/dL 9.1  Total Protein 6.5 - 8.1 g/dL 7.1  Total Bilirubin 0.3 - 1.2 mg/dL 0.3  Alkaline Phos 38 - 126 U/L 62  AST 15 - 41 U/L 17  ALT 0 - 44 U/L 15    Discharge instructions: Per After Visit Summary and "Baby and Me Booklet"  After visit meds:  Allergies as of 06/14/2018   No Known Allergies     Medication List    TAKE these medications   ibuprofen 600 MG tablet Commonly known as:  ADVIL,MOTRIN Take 1 tablet (600 mg total) by mouth every 6 (six) hours.   PRENATE MINI 18-0.6-0.4-350 MG Caps Take 1 tablet by mouth daily.   sertraline 50 MG tablet Commonly known as:  ZOLOFT Take 1 tablet (50 mg total) by mouth daily.       Postpartum contraception: Nexplanon Diet: Routine Diet Activity: Advance as tolerated. Pelvic rest for 6 weeks.  Outpatient follow up:4 weeks Follow-up Appt:No future appointments. Follow-up Visit:No follow-ups on file.  Newborn Data: Live born female  Birth Weight: 5 lb 12.4 oz (2620 g) APGAR: 8, 9  Newborn Delivery   Birth date/time:  06/12/2018 19:47:00 Delivery type:  Vaginal, Spontaneous     Baby Feeding: Bottle and Breast Disposition:home with mother  Mary NievesJulia Abraham, MD Family Medicine Resident  CNM attestation I have seen and examined this patient and agree with above documentation in the resident's note.   Mary Curry is a 27 y.o. [id number] s/p SVD.   Pain is well controlled.  Plan for birth control is Nexplanon.  Method of Feeding: both  PE:  BP 117/74 (BP Location: Left Arm)   Pulse 70   Temp 97.7 F (36.5 C) (Other (Comment))   Resp 18   Ht 5\' 5"  (1.651 m)   Wt 92.5 kg   LMP 09/21/2017 (Exact Date)    SpO2 100%   Breastfeeding? Unknown   BMI 33.93 kg/m  Fundus firm  Recent Labs    06/12/18 0422 06/13/18 0539  HGB 10.2* 9.5*  HCT 28.3* 26.0*     Plan: discharge today - postpartum care discussed - f/u clinic in 4 weeks for postpartum visit   Mary Curry, Mousa Prout, CNM 9:35 AM  06/14/2018

## 2018-06-14 NOTE — Lactation Note (Addendum)
This note was copied from a baby's chart. Lactation Consultation Note  Patient Name: Mary Jerene PitchFanta Dethlefs ZOXWR'UToday's Date: 06/14/2018 Reason for consult: Follow-up assessment;1st time breastfeeding;Primapara;Early term 37-38.6wks;Infant < 6lbs  Visited with P1 Mom of ET infant at 2338 hrs old.  Baby is at 4% weight loss, output 4 voids and 2 stools last 24 hrs.    Mom hasn't pumped since yesterday.  Baby has had 5 supplements of 1-12 ml formula (Neosure) by paced bottle.  Baby has 2-3 attempts at feeding at breast.  Mom states she is very tired.  Last formula feeding was 3 hrs ago per FOB.    Unwrapped baby and placed him STS.  Assisted Mom to use breast massage and hand expression.  Colostrum drop expressed.  Mom has large, heavy breasts and short shafted nipples.  Baby not rooting or trying to latch.  Mom is a candidate for a nipple shield, once baby starts cueing he is hungry.  Encouraged STS as much as possible.  Assisted FOB in paced bottle feeding.  Volume today to increase to 10-20 ml at each feeding.    Assisted Mom to double pump on initiation setting.  Encouraged Mom to to stay on regular schedule of at least every 3 hrs.    [facility], as Mom doesn't have a DEBP at home.  Reffered to OP lactation for follow-up.     Interventions Interventions: Breast feeding basics reviewed;Assisted with latch;Skin to skin;Breast massage;Hand express;Pre-pump if needed;Breast compression;Adjust position;Support pillows;Position options;Hand pump;DEBP  Lactation Tools Discussed/Used Tools: Bottle Breast pump type: Double-Electric Breast Pump   Consult Status Consult Status: Complete Date: 06/14/18 Follow-up type: Out-patient    Judee ClaraSmith, Jolette Lana E 06/14/2018, 9:52 AM

## 2018-06-14 NOTE — Progress Notes (Signed)
Pt called out to nurses station with complaints of chest pain. Upon assessment, pt complains of 6/10 chest pain that begins under collar bone and radiates between shoulders. Pt describes the pain as tightness and heaviness. Pt states that the pain is worse with deep breathing and activity. Vital signs BP: 118/62, 85 HR and 20 RR. Dr. Darin EngelsAbraham notified at 0050 of change in condition and will come evaluate the patient. No new orders placed. Will continue to monitor.

## 2018-06-14 NOTE — Progress Notes (Signed)
CSW received consult for hx of Anxiety and Depression.  CSW met with MOB in room 123 to offer support and complete assessment.    When CSW arrived, MOB was bonding with infant as evidence by engaging in skin to skin and breastfeeding.  FOB was also present and was observing MOB's and infant's interactions.  MOB appeared frustrated and communicated her challenges with breastfeeding.  CSW offered to get MOB's bedside nurse for assistance with latching and MOB was appreciative. When CSW returned to MOB's room, MOB eagerly reported her feelings of happiness about infant latching without the support of the medical team. CSW encouraged MOB to continue to be patient with breastfeeding and to seek help if needed; MOB agreed.   CSW asked about MOB's MH hx and MOB denied having a hx.  MOB reported having a "bad" day on the day of an OB visit.  MOB reported feeling overwhelmed and sad but communicated it was an isolated incident.    MOB reported having all essential items needed for infant and feeling prepare to parent. MOB also shared that she and FOB have a great support team that is willing to assist at anytime.   CSW provided education regarding the baby blues period vs. perinatal mood disorders, discussed treatment and gave resources for mental health follow up if concerns arise.  CSW recommends self-evaluation during the postpartum time period using the New Mom Checklist from Postpartum Progress and encouraged MOB to contact a medical professional if symptoms are noted at any time. MOB presented with insight and awareness and did not demonstrate in acute MH symptoms. CSW assessed for safety and MOB denied SI, HI, and DV.   CSW provided review of Sudden Infant Death Syndrome (SIDS) precautions.    CSW identifies no further need for intervention and no barriers to discharge at this time.  Mary Curry, MSW, LCSW Clinical Social Work [phone]   

## 2018-06-14 NOTE — Discharge Instructions (Signed)

## 2018-06-15 ENCOUNTER — Other Ambulatory Visit: Payer: Medicaid Other

## 2018-06-15 ENCOUNTER — Ambulatory Visit: Payer: Self-pay

## 2018-06-15 ENCOUNTER — Encounter: Payer: Medicaid Other | Admitting: Advanced Practice Midwife

## 2018-06-15 NOTE — Lactation Note (Signed)
This note was copied from a baby's chart. Lactation Consultation Note  Patient Name: Mary Curry ZOXWR'UToday's Date: 06/15/2018 Reason for consult: Follow-up assessment;1st time breastfeeding;Primapara;Early term 37-38.6wks  P1 mother whose infant is now 463 hours old.  This is an ETI at 37+5 weeks.  Mother has been breast feeding/bottle feeding. Encouraged mother to continue putting baby to breast 8-12 times/24 hours or sooner if he shows feeding cues.  Educated her on the importance of putting baby to breast so he learns how to latch and feed prior to giving any formula supplementation.  She stated that her goal would be to eventually breast feed only.  With this known, I encouraged breast feeding every time with supplement to follow.  Mother familiar with supplementation guidelines and knows the new increased amount for today.  Engorgement prevention/treatment discussed.  [facility] given with instructions on how and when to return the pump.  $30.00 cash collected and placed in [employer]'s mailbox.  Parents given copy of rental agreement.    Baby has a return pediatric visit for tomorrow.  Mother has our OP phone number for questions/after discharge.  No further questions/concerns noted.  Parents are ready for discharge.     Maternal Data Has patient been taught Hand Expression?: Yes Does the patient have breastfeeding experience prior to this delivery?: No  Feeding    LATCH Score                   Interventions    Lactation Tools Discussed/Used WIC Program: Yes   Consult Status Consult Status: Complete Date: 06/15/18 Follow-up type: Call as needed    Shabana Armentrout R Lilianne Delair 06/15/2018, 10:59 AM

## 2018-06-27 ENCOUNTER — Other Ambulatory Visit: Payer: Self-pay

## 2018-06-27 ENCOUNTER — Inpatient Hospital Stay
Admission: AD | Admit: 2018-06-27 | Discharge: 2018-06-28 | DRG: 776 | Disposition: A | Discharge status: Home / Self Care | Payer: Medicaid Other | Attending: Obstetrics & Gynecology | Admitting: Obstetrics & Gynecology

## 2018-06-27 ENCOUNTER — Ambulatory Visit (INDEPENDENT_AMBULATORY_CARE_PROVIDER_SITE_OTHER): Payer: Medicaid Other | Admitting: Obstetrics and Gynecology

## 2018-06-27 VITALS — BP 146/94 | Ht 65.0 in | Wt 184.0 lb

## 2018-06-27 DIAGNOSIS — O1415 Severe pre-eclampsia, complicating the puerperium: Secondary | ICD-10-CM

## 2018-06-27 DIAGNOSIS — O1495 Unspecified pre-eclampsia, complicating the puerperium: Secondary | ICD-10-CM

## 2018-06-27 LAB — COMPREHENSIVE METABOLIC PANEL
ALK PHOS: 100 U/L (ref 38–126)
ALT: 21 U/L (ref 0–44)
ANION GAP: 9 (ref 5–15)
AST: 24 U/L (ref 15–41)
Albumin: 3.7 g/dL (ref 3.5–5.0)
BILIRUBIN TOTAL: 0.8 mg/dL (ref 0.3–1.2)
BUN: 9 mg/dL (ref 6–20)
CO2: 24 mmol/L (ref 22–32)
CREATININE: 0.49 mg/dL (ref 0.44–1.00)
Calcium: 8.9 mg/dL (ref 8.9–10.3)
Chloride: 107 mmol/L (ref 98–111)
Glucose, Bld: 89 mg/dL (ref 70–99)
Potassium: 3.5 mmol/L (ref 3.5–5.1)
SODIUM: 140 mmol/L (ref 135–145)
Total Protein: 6.7 g/dL (ref 6.5–8.1)

## 2018-06-27 LAB — TYPE AND SCREEN
ABO/RH(D): AB POS
Antibody Screen: NEGATIVE

## 2018-06-27 LAB — PROTEIN / CREATININE RATIO, URINE
Creatinine, Urine: 132 mg/dL
PROTEIN CREATININE RATIO: 0.05 mg/mg{creat} (ref 0.00–0.15)
TOTAL PROTEIN, URINE: 6 mg/dL

## 2018-06-27 LAB — CBC
HCT: 34.4 % — ABNORMAL LOW (ref 36.0–46.0)
Hemoglobin: 12.2 g/dL (ref 12.0–15.0)
MCH: 27.6 pg (ref 26.0–34.0)
MCHC: 35.5 g/dL (ref 30.0–36.0)
MCV: 77.8 fL — ABNORMAL LOW (ref 80.0–100.0)
NRBC: 0 % (ref 0.0–0.2)
PLATELETS: 302 10*3/uL (ref 150–400)
RBC: 4.42 MIL/uL (ref 3.87–5.11)
RDW: 13.4 % (ref 11.5–15.5)
WBC: 7.9 10*3/uL (ref 4.0–10.5)

## 2018-06-27 IMAGING — US US OB COMP LESS 14 WK
1 series · 13 of 28 positions shown · non-contrast
Comparison: 05/20/2017

CLINICAL DATA: Vaginal bleeding for 2 days. Estimated gestational
age by LMP is 11 weeks 4 days. Quantitative beta HCG is and process.

EXAM:
OBSTETRIC <14 WK US AND TRANSVAGINAL OB US
TECHNIQUE: Both transabdominal and transvaginal ultrasound examinations were
performed for complete evaluation of the gestation as well as the
maternal uterus, adnexal regions, and pelvic cul-de-sac.
Transvaginal technique was attempted to assess early pregnancy.
Patient was unable tolerate transvaginal imaging.

[Series 1: us ob comp less 14 wk · 0.10mm/px · 13 of 34 slices shown]
[im 2/34]
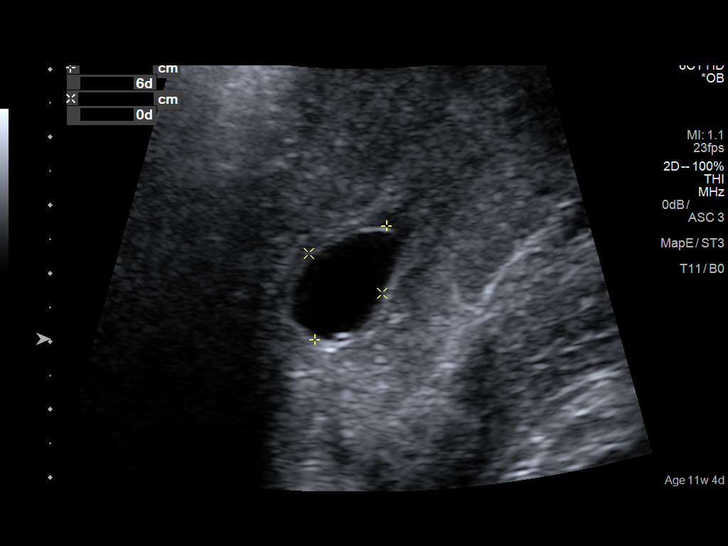
[im 4/34]
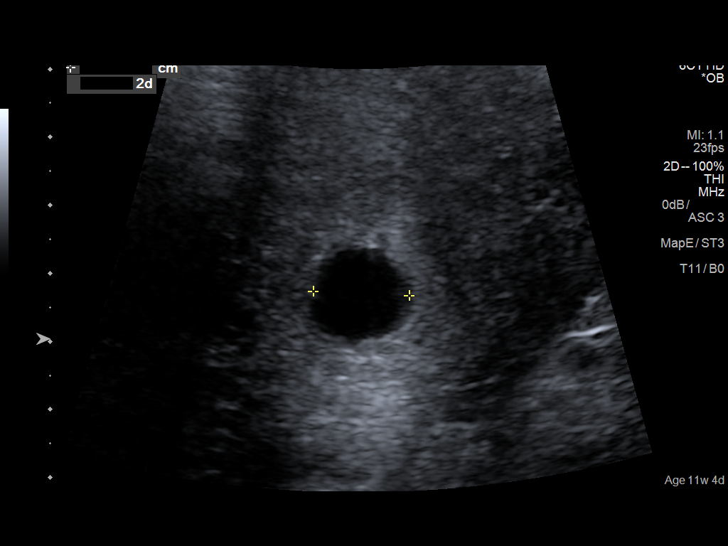
[im 7/34]
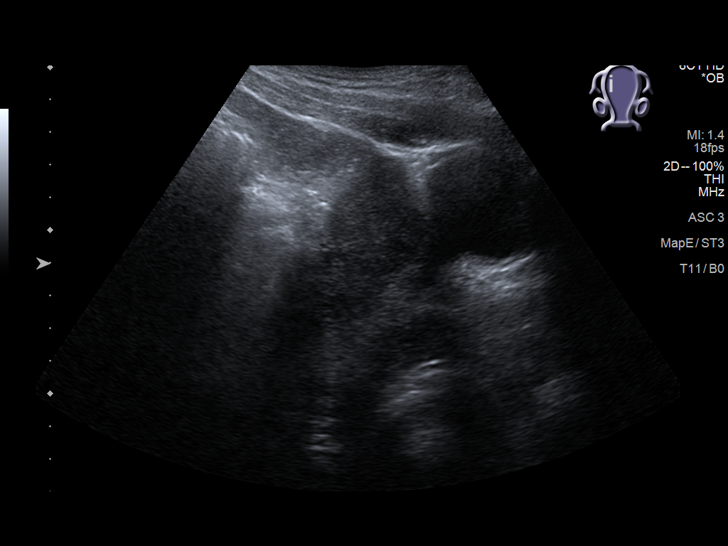
[im 9/34]
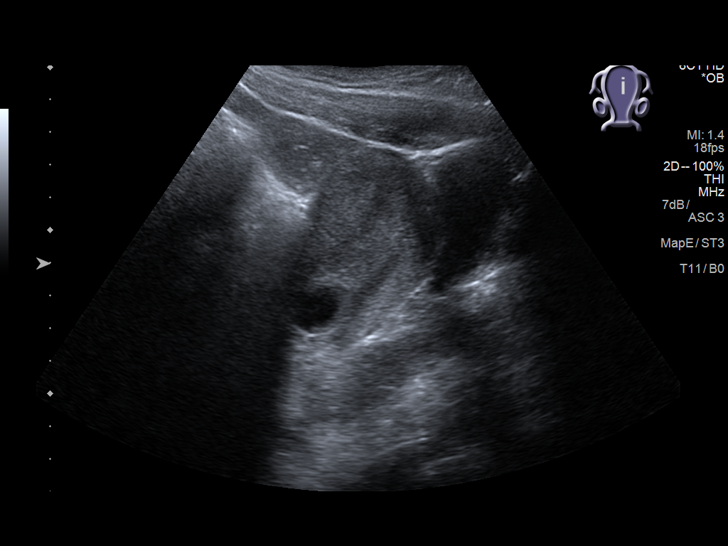
[im 12/34]
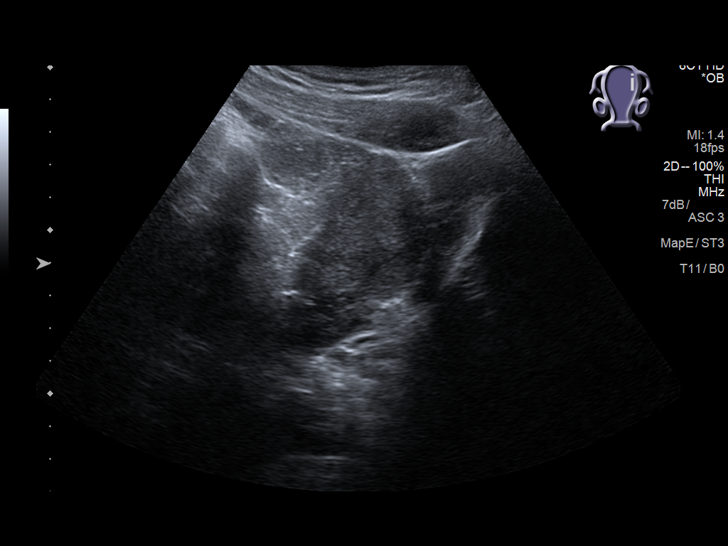
[im 14/34]
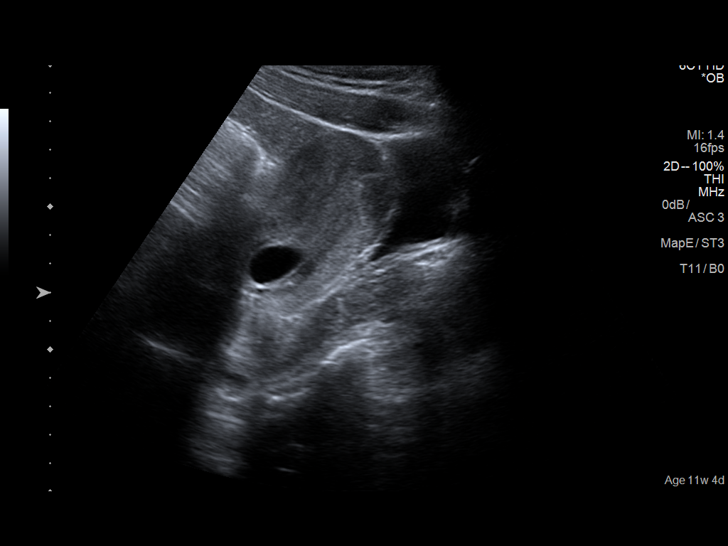
[im 18/34]
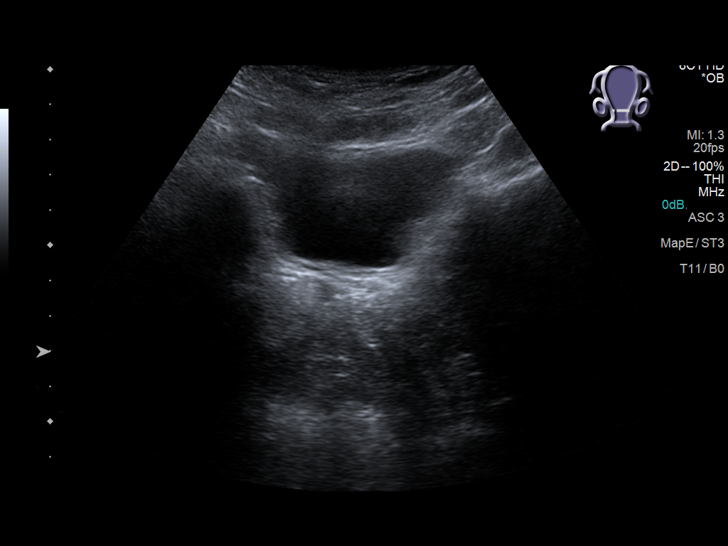
[im 20/34]
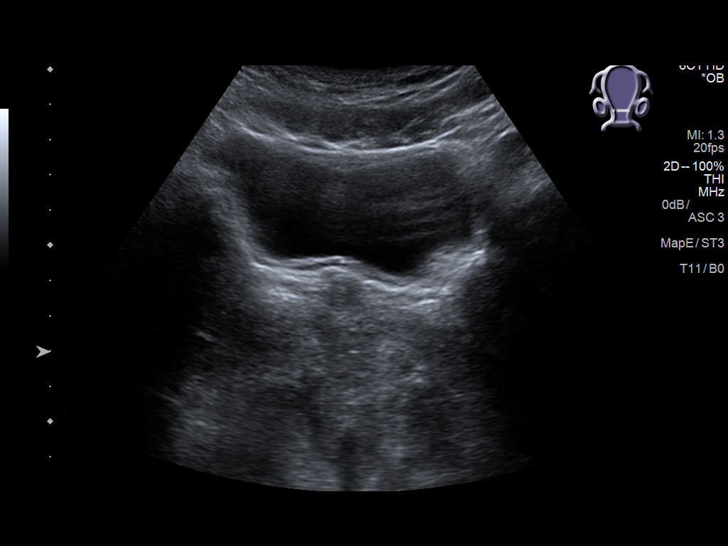
[im 23/34]
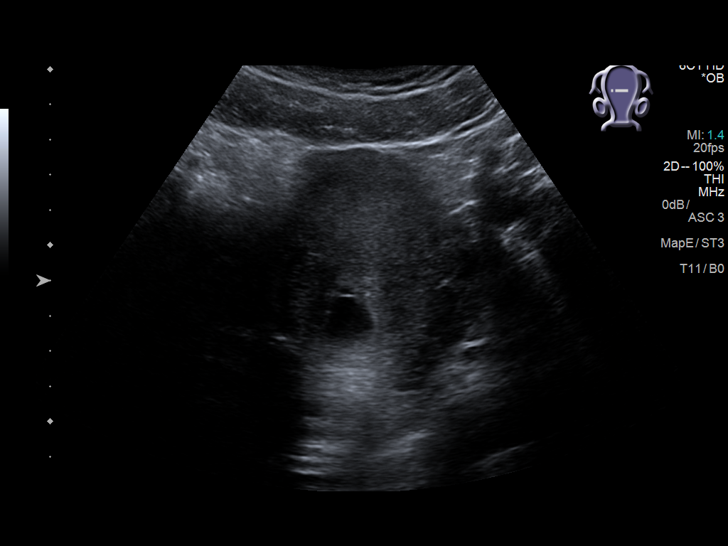
[im 25/34]
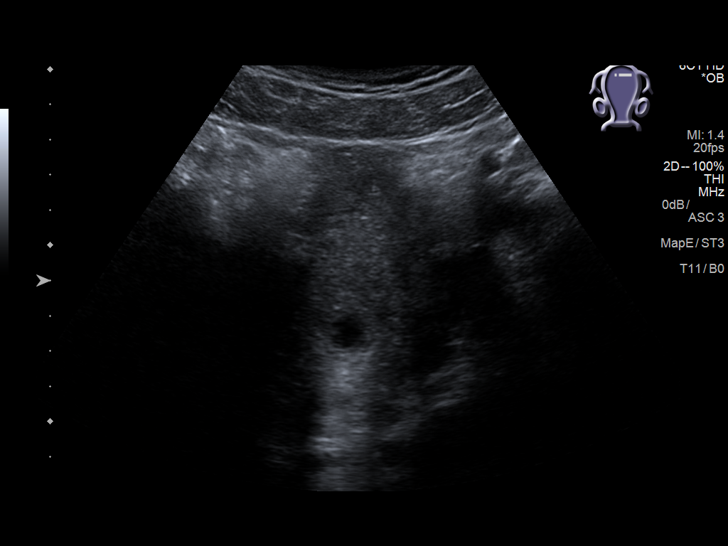
[im 27/34]
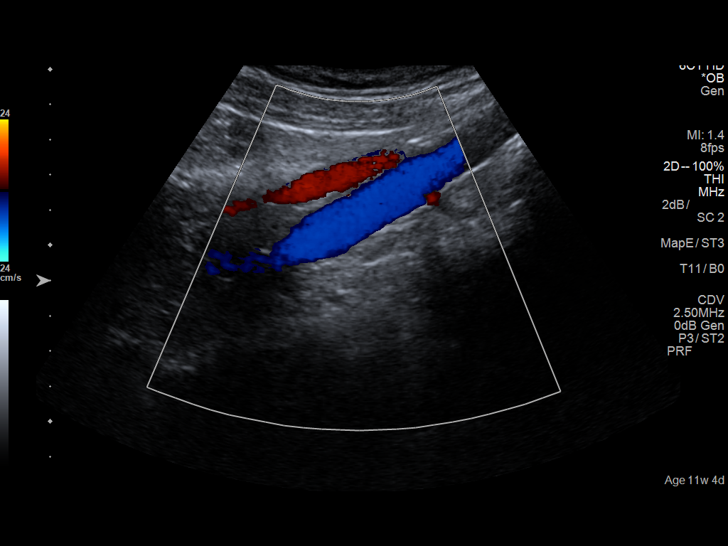
[im 30/34]
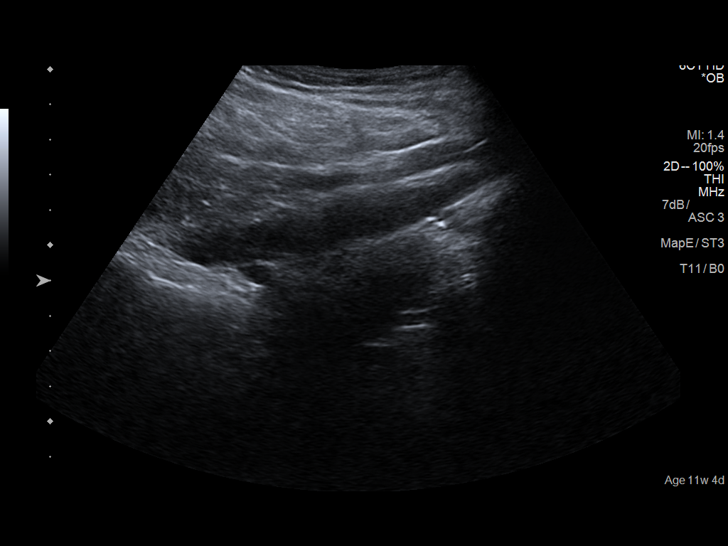
[im 32/34]
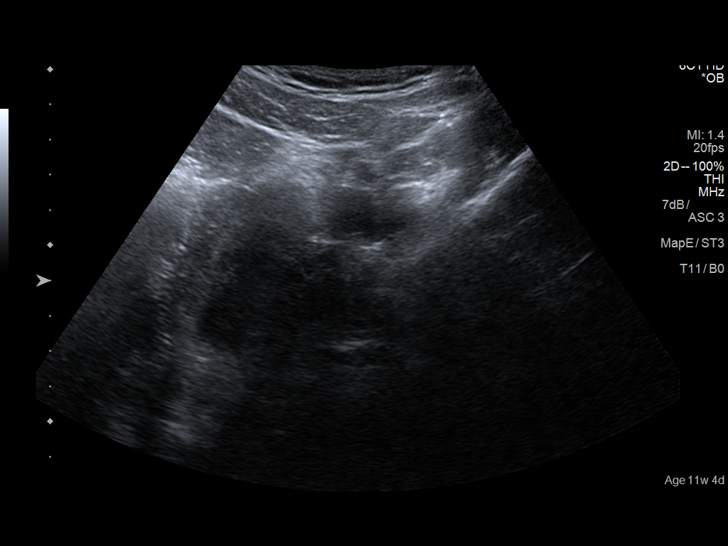

[13 of 28 positions shown; findings below may reference images not displayed]

FINDINGS: Intrauterine gestational sac: An intrauterine gestational sac is
demonstrated within the lower uterine segment.

Yolk sac:  Yolk sac is not visualized.

Embryo:  Fetal pole is not identified.

Cardiac Activity: Not visualized.

MSD: 15.4  mm   6 w   2  d

Subchorionic hemorrhage:  None visualized.

Maternal uterus/adnexae: The uterus is anteverted. No myometrial
masses are demonstrated. Ovaries are not visualized. No free fluid
in the pelvis.
IMPRESSION: Intrauterine gestational sac is demonstrated in the lower uterine
segment. Estimated gestational age based on mean sac diameter is 6
weeks 2 days. This represents less than expected growth since the
previous study. Fetal pole and yolk sac are not visualized today.
Transvaginal imaging could not be performed because patient was
unable tolerate. Transvaginal images would be more sensitive for
evaluation of fetal pole and yolk sac. However, based on lack of
growth of the mean gestational sac diameter and nonvisualization of
fetal pole and yolk sac, examination is suspicious for failed
intrauterine pregnancy. Suggest follow-up us air quantitative beta
HCG and short-term follow-up ultrasound if clinically indicated.

## 2018-06-27 MED ORDER — MAGNESIUM SULFATE BOLUS VIA INFUSION
4.0000 g | Freq: Once | INTRAVENOUS | Status: AC
  Administered 2018-06-27: 4 g via INTRAVENOUS
  Filled 2018-06-27: qty 500

## 2018-06-27 MED ORDER — HYDRALAZINE HCL 20 MG/ML IJ SOLN: 10.0000 mg | INTRAMUSCULAR | Status: DC | PRN

## 2018-06-27 MED ORDER — ACETAMINOPHEN 500 MG PO TABS
1000.0000 mg | ORAL_TABLET | Freq: Four times a day (QID) | ORAL | Status: DC | PRN
  Administered 2018-06-27 – 2018-06-28 (×3): 1000 mg via ORAL
  Filled 2018-06-27 (×3): qty 2

## 2018-06-27 MED ORDER — LACTATED RINGERS IV SOLN
INTRAVENOUS | Status: DC
  Administered 2018-06-27 – 2018-06-28 (×3): via INTRAVENOUS

## 2018-06-27 MED ORDER — LABETALOL HCL 5 MG/ML IV SOLN: 80.0000 mg | INTRAVENOUS | Status: DC | PRN

## 2018-06-27 MED ORDER — MAGNESIUM SULFATE 40 G IN LACTATED RINGERS - SIMPLE
2.0000 g/h | INTRAVENOUS | Status: DC
  Administered 2018-06-28: 2 g/h via INTRAVENOUS
  Filled 2018-06-27 (×2): qty 500

## 2018-06-27 MED ORDER — LABETALOL HCL 5 MG/ML IV SOLN
40.0000 mg | INTRAVENOUS | Status: DC | PRN
  Administered 2018-06-27: 40 mg via INTRAVENOUS
  Filled 2018-06-27: qty 8

## 2018-06-27 MED ORDER — FAMOTIDINE 20 MG PO TABS
20.0000 mg | ORAL_TABLET | Freq: Two times a day (BID) | ORAL | Status: DC
  Administered 2018-06-27: 20 mg via ORAL
  Filled 2018-06-27 (×2): qty 1

## 2018-06-27 MED ORDER — LABETALOL HCL 5 MG/ML IV SOLN
20.0000 mg | INTRAVENOUS | Status: DC | PRN
  Administered 2018-06-27: 20 mg via INTRAVENOUS
  Filled 2018-06-27: qty 4

## 2018-06-27 MED ORDER — CALCIUM CARBONATE ANTACID 500 MG PO CHEW
1.0000 | CHEWABLE_TABLET | Freq: Three times a day (TID) | ORAL | Status: DC
  Administered 2018-06-27: 200 mg via ORAL
  Filled 2018-06-27 (×2): qty 1

## 2018-06-27 NOTE — H&P (Signed)
Obstetric H&P   Chief Complaint: Headache and elevated blood pressure in clinic, suspected postpartum pre-eclampsia  Prenatal Care Provider: [facility]  History of Present Illness: 27 y.o. [id number] presenting to L&D as a direct admission from clinic after a postpartum visit. She delivered vaginally at [facility] on 06/12/2018. She states that she has had daily headaches since delivery, worsening in the last week. She has had some relief with ibuprofen. Today her headache is severe and she has not taken any medications. She rates it as 8/10. She has noticed some blurry vision, no spots or floaters. She denies epigastric pain but does have tenderness under her ribcage on the right side. She does not have edema; has been wearing compression stockings. She had a home visit from a postpartum nurse yesterday and her blood pressure was in the 140s/90s. Today in clinic her blood pressure was 146/94.  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 He states he has already been tested and is hgb AA.       Supervision of low-risk pregnancy 11/08/2017 by Conard Novak, MD No   Overview Addendum 06/01/2018  8:07 AM by Vena Austria, MD    Clinic [facility] Prenatal Labs  Dating LMP=6 week [place] Blood type: AB/Positive/-- (04/10 1518)   Genetic Screen Declined trisomy testing Inheritest negative Antibody:Negative (04/10 1518)  Anatomic [place] Female, incomplete [X]  complete 24  weeks Rubella: 13.70 (04/10 1518) Varicella: Immune  GTT 99 RPR: Non Reactive (04/10 1518)   Rhogam N/A HBsAg: Negative (04/10 1518)   TDaP vaccine                       Flu Shot:05/02/18 HIV: Non Reactive (04/10 1518)   Baby Food                                GBS: Positive  Contraception  Pap: 11/08/17 NIL  CBB     CS/VBAC    Support Person                Review of Systems: Review of systems negative unless otherwise noted in HPI.  Past Medical History: Past Medical History:  Diagnosis Date  . Anemia   . 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 Miscarriage     Past Surgical History: Past Surgical History:  Procedure Laterality Date  . DILATION AND CURETTAGE OF UTERUS N/A 06/29/2017   Procedure: DILATATION AND CURETTAGE;  Surgeon: Vena Austria, MD;  Location: [facility] ORS;  Service: Gynecology;  Laterality: N/A;  . NO PAST SURGERIES      Past Obstetric History: [id number]  Family History: Family History  Problem Relation Age of Onset  . Hypertension Mother     Social History: Social History   Socioeconomic History  . Marital status: Single    Spouse name: Not on file  . Number of children:  Not on file  . Years of education: Not on file  . Highest education level: Not on file  Occupational History  . Not on file  Social Needs  . Financial resource strain: Not hard at all  . Food insecurity:    Worry: Never true    Inability: Never true  . Transportation needs:    Medical: No    Non-medical: No  Tobacco Use  . Smoking status: Never Smoker  . Smokeless tobacco: Never Used  Substance and Sexual Activity  . Alcohol use: No  . Drug use: No  . Sexual activity: Yes    Birth control/protection: None  Lifestyle  . Physical activity:    Days per week: 0 days    Minutes per session: 0 min  . Stress: Not at all  Relationships  . Social connections:    Talks on phone: Three times a week    Gets together: Three times a week    Attends religious service:  Never    Active member of club or organization: No    Attends meetings of clubs or organizations: Never    Relationship status: Not on file  . Intimate partner violence:    Fear of current or ex partner: No    Emotionally abused: No    Physically abused: No    Forced sexual activity: No  Other Topics Concern  . Not on file  Social History Narrative  . Not on file    Medications: Prior to Admission medications   Medication Sig Start Date End Date Taking? Authorizing Provider  ibuprofen (ADVIL,MOTRIN) 600 MG tablet Take 1 tablet (600 mg total) by mouth every 6 (six) hours. 06/14/18  Yes Bhambri, Melanie, CNM  Prenat-FeCbn-FeAsp-Meth-FA-DHA (PRENATE MINI) 18-0.6-0.4-350 MG CAPS Take 1 tablet by mouth daily. 11/08/17  Yes Conard Novak, MD  sertraline (ZOLOFT) 50 MG tablet Take 1 tablet (50 mg total) by mouth daily. Patient not taking: Reported on 06/27/2018 05/16/18   Vena Austria, MD    Allergies: No Known Allergies  Physical Exam: Vitals: Blood pressure (!) 165/102, pulse 66, temperature 97.7 F (36.5 C), temperature source Oral, resp. rate 16, height 5\' 5"  (1.651 m), weight 83.5 kg, last menstrual period 09/21/2017, unknown if currently breastfeeding.  General: NAD HEENT: normocephalic, anicteric Pulmonary: No increased work of breathing, CTAB Cardiovascular: RRR, no murmurs, rubs, or gallops Abdomen: Soft, non-tender to palpation Genitourinary: deferred Extremities: no edema, erythema, mild tenderness to palpation on front of BLE Neurologic: Grossly intact Psychiatric: mood appropriate, affect full  Labs: No results found for this or any previous visit (from the past 24 hour(s)).  Assessment: 27 y.o. [id number] [redacted]w[redacted]d by 06/28/2018, by Last Menstrual Period with elevated blood pressures and suspected pre-eclampsia  Plan: 1) Labs to assess for pre-eclampsia: CBC, CMP, Type and screen, PC ratio  2) Tylenol 1000 mg for headache.  3) Plan to start magnesium as she  has symptoms of headache, blurred vision and elevated pressures 150s-160s/90s-100s here on Labor and Delivery. Ordered IV labetalol for severe range pressures.  4) PNL - Blood type --/--/AB POS (11/12 0422) / Anti-bodyscreen NEG (11/12 0422) / Rubella 13.70 (04/10 1518) / Varicella Immune / RPR Non Reactive (11/12 0422) / HBsAg Negative (04/10 1518) / HIV Non Reactive (09/09 1457)  / GBS Positive (10/30 1633)  5) Immunization History -  Immunization History  Administered Date(s) Administered  . Influenza,inj,Quad PF,6+ Mos 05/02/2018    6) Disposition - Admit to inpatient, NPO for now, bedrest with bedside commode  as needed, SCDs.  Marcelyn BruinsJacelyn Lyndzee Kliebert, CNM 06/27/2018  1:16 PM

## 2018-06-27 NOTE — Progress Notes (Addendum)
Obstetrics & Gynecology Office Visit   Chief Complaint  Patient presents with  . Blood Pressure Check   History of Present Illness: 27 y.o. [id number] female who is 2 weeks postpartum from an uncomplicated vaginal delivery at 6775w5d.  Her delivery and hospital course were uncomplicated. She had no blood pressures issues. She presents today with a headache and visual changes. Only sleeping makes her headache somewhat better. Nothing makes it worse.  She has blurry vision. She denies right upper quadrant pain.  She has had edema, but states it is not as bad today.  Her lochia is minimal.  She denies fevers, chills, upper respiratory (including sinus) symptoms. She denies a history of headaches.  She is taking only prenatal vitamins and is breastfeeding.  She has not started Zoloft, but states that she is doing fine from a mood standpoint.  She states that a Child psychotherapistsocial worker came to her house yesterday and her BP was 140's/90s and she was encouraged to make an appointment to be seen.   Past Medical History:  Diagnosis Date  . Anemia   . 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 Miscarriage     Past Surgical History:  Procedure Laterality Date  . DILATION AND CURETTAGE OF UTERUS N/A 06/29/2017   Procedure: DILATATION AND CURETTAGE;  Surgeon: [name], Andreas, MD;  Location: [facility] ORS;  Service: Gynecology;  Laterality: N/A;  . NO PAST SURGERIES      Gynecologic History: [name] was 09/21/2017 (exact date).  Obstetric History: [id number]  Family History  Problem Relation Age of Onset  . Hypertension Mother     Social History   Socioeconomic History  . Marital status: Single    Spouse name: Not on file  . Number of children: Not on file  . Years of education: Not on file  . Highest education level: Not on file  Occupational History  . Not on file  Social Needs  . Financial resource strain: Not hard at all  . Food insecurity:    Worry: Never true    Inability:  Never true  . Transportation needs:    Medical: No    Non-medical: No  Tobacco Use  . Smoking status: Never Smoker  . Smokeless tobacco: Never Used  Substance and Sexual Activity  . Alcohol use: No  . Drug use: No  . Sexual activity: Yes    Birth control/protection: None  Lifestyle  . Physical activity:    Days per week: 0 days    Minutes per session: 0 min  . Stress: Not at all  Relationships  . Social connections:    Talks on phone: Three times a week    Gets together: Three times a week    Attends religious service: Never    Active member of club or organization: No    Attends meetings of clubs or organizations: Never    Relationship status: Not on file  . Intimate partner violence:    Fear of current or ex partner: No    Emotionally abused: No    Physically abused: No    Forced sexual activity: No  Other Topics Concern  . Not on file  Social History Narrative  . Not on file    No Known Allergies  Prior to Admission medications   Medication Sig Start Date End Date Taking? Authorizing Provider  ibuprofen (ADVIL,MOTRIN) 600 MG tablet Take 1 tablet (600 mg total) by mouth every 6 (six) hours. 06/14/18   Donette LarryBhambri, Melanie, CNM  Prenat-FeCbn-FeAsp-Meth-FA-DHA (PRENATE MINI) 18-0.6-0.4-350 MG CAPS Take 1 tablet by mouth daily. 11/08/17   Conard Novak, MD    Review of Systems  Constitutional: Negative.   HENT: Negative.  Negative for congestion and sinus pain.   Eyes: Positive for blurred vision. Negative for double vision, photophobia and pain.  Respiratory: Negative.   Cardiovascular: Positive for leg swelling. Negative for chest pain, palpitations, orthopnea, claudication and PND.  Gastrointestinal: Negative.   Genitourinary: Negative.   Musculoskeletal: Negative.   Skin: Negative.   Neurological: Positive for headaches. Negative for dizziness, tingling, tremors, sensory change, speech change, focal weakness, seizures, loss of consciousness and weakness.    Psychiatric/Behavioral: Negative.      Physical Exam BP (!) 146/94   Ht 5\' 5"  (1.651 m)   Wt 184 lb (83.5 kg)   LMP 09/21/2017 (Exact Date)   BMI 30.62 kg/m  [name] was 09/21/2017 (exact date).  Repeat BP 140/84 Physical Exam  Constitutional: She is oriented to person, place, and time. She appears well-developed and well-nourished. No distress.  HENT:  Head: Normocephalic and atraumatic.  Eyes: Conjunctivae are normal. No scleral icterus.  Neck: Normal range of motion. Neck supple. No thyromegaly present.  Cardiovascular: Normal rate and regular rhythm. Exam reveals no gallop and no friction rub.  No murmur heard. Pulmonary/Chest: Effort normal and breath sounds normal. No respiratory distress. She has no wheezes. She has no rales.  Abdominal: Soft. Bowel sounds are normal.  Uterus at U-2  Musculoskeletal: Normal range of motion. She exhibits tenderness (tenderness of bilateral ankles). She exhibits no edema.  Neurological: She is alert and oriented to person, place, and time. She displays normal reflexes. No cranial nerve deficit.  Skin: Skin is warm and dry. No erythema.  Psychiatric: She has a normal mood and affect. Her behavior is normal. Judgment normal.   POCT: urine dipstick: negative protein  Assessment: 27 y.o. [id number] female here for  1. Preeclampsia in postpartum period      Plan: Problem List Items Addressed This Visit    None    Visit Diagnoses    Preeclampsia in postpartum period    -  Primary     Given the constellation of findings today clinic, including her persistently elevated blood pressure where she had demonstrated normal blood pressure in the hospital at delivery, as well as her persistent headache that does not resolve with Tylenol and ibuprofen and she also has blurry vision, which is new for her, I am concerned for preeclampsia in the postpartum period with severe features.  Her urine protein dip in clinic today was  negative.  However given her risk for eclampsia, I recommend she be sent for admission for magnesium treatment.  Discussed this recommendation with the patient and she agrees with the plan.  I discussed admission with the covering attending, who agrees with that assessment and plan.  Labor and delivery has been notified and has accepted the patient.  I offered transportation to the patient to the hospital.  However, she is close to the hospital in our clinic can refuses and wishes to drive herself.  15 minutes spent in face to face discussion with > 50% spent in counseling,management, and coordination of care of her preeclampsia in the postpartum.  Thomasene Mohair, MD 06/27/2018 1:10 PM

## 2018-06-28 DIAGNOSIS — O1415 Severe pre-eclampsia, complicating the puerperium: Secondary | ICD-10-CM

## 2018-06-28 LAB — MAGNESIUM: MAGNESIUM: 5.7 mg/dL — AB (ref 1.7–2.4)

## 2018-06-28 MED ORDER — LACTULOSE 10 GM/15ML PO SOLN
10.0000 g | Freq: Two times a day (BID) | ORAL | Status: DC | PRN
  Filled 2018-06-28: qty 30

## 2018-06-28 MED ORDER — DOCUSATE SODIUM 100 MG PO CAPS
100.0000 mg | ORAL_CAPSULE | Freq: Two times a day (BID) | ORAL | Status: DC | PRN
  Administered 2018-06-28: 100 mg via ORAL
  Filled 2018-06-28: qty 1

## 2018-06-28 NOTE — Discharge Instructions (Signed)
Postpartum Hypertension Postpartum hypertension is high blood pressure after pregnancy that remains higher than normal for more than two days after delivery. You may not realize that you have postpartum hypertension if your blood pressure is not being checked regularly. In some cases, postpartum hypertension will go away on its own, usually within a week of delivery. However, for some women, medical treatment is required to prevent serious complications, such as seizures or stroke. The following things can affect your blood pressure:  The type of delivery you had.  Having received IV fluids or other medicines during or after delivery.  What are the causes? Postpartum hypertension may be caused by any of the following or by a combination of any of the following:  Hypertension that existed before pregnancy (chronic hypertension).  Gestational hypertension.  Preeclampsia or eclampsia.  Receiving a lot of fluid through an IV during or after delivery.  Medicines.  HELLP syndrome.  Hyperthyroidism.  Stroke.  Other rare neurological or blood disorders.  In some cases, the cause may not be known. What increases the risk? Postpartum hypertension can be related to one or more risk factors, such as:  Chronic hypertension. In some cases, this may not have been diagnosed before pregnancy.  Obesity.  Type 2 diabetes.  Kidney disease.  Family history of preeclampsia.  Other medical conditions that cause hormonal imbalances.  What are the signs or symptoms? As with all types of hypertension, postpartum hypertension may not have any symptoms. Depending on how high your blood pressure is, you may experience:  Headaches. These may be mild, moderate, or severe. They may also be steady, constant, or sudden in onset (thunderclap headache).  Visual changes.  Dizziness.  Shortness of breath.  Swelling of your hands, feet, lower legs, or face. In some cases, you may have swelling  in more than one of these locations.  Heart palpitations or a racing heartbeat.  Difficulty breathing while lying down.  Decreased urination.  Other rare signs and symptoms may include:  Sweating more than usual. This lasts longer than a few days after delivery.  Chest pain.  Sudden dizziness when you get up from sitting or lying down.  Seizures.  Nausea or vomiting.  Abdominal pain.  How is this diagnosed? The diagnosis of postpartum hypertension is made through a combination of physical examination findings and testing of your blood and urine. You may also have additional tests, such as a CT scan or an MRI, to check for other complications of postpartum hypertension. How is this treated? When blood pressure is high enough to require treatment, your options may include:  Medicines to reduce blood pressure (antihypertensives). Tell your health care provider if you are breastfeeding or if you plan to breastfeed. There are many antihypertensive medicines that are safe to take while breastfeeding.  Stopping medicines that may be causing hypertension.  Treating medical conditions that are causing hypertension.  Treating the complications of hypertension, such as seizures, stroke, or kidney problems.  Your health care provider will also continue to monitor your blood pressure closely and repeatedly until it is within a safe range for you. Follow these instructions at home:  Take medicines only as directed by your health care provider.  Get regular exercise after your health care provider tells you that it is safe.  Follow your health care providers recommendations on fluid and salt restrictions.  Do not use any tobacco products, including cigarettes, chewing tobacco, or electronic cigarettes. If you need help quitting, ask your health care  provider.  Keep all follow-up visits as directed by your health care provider. This is important. Contact a health care provider  if:  Your symptoms get worse.  You have new symptoms, such as: ? Headache. ? Dizziness. ? Visual changes. Get help right away if:  You develop a severe or sudden headache.  You have seizures.  You develop numbness or weakness on one side of your body.  You have difficulty thinking, speaking, or swallowing.  You develop severe abdominal pain.  You develop difficulty breathing, chest pain, a racing heartbeat, or heart palpitations. These symptoms may represent a serious problem that is an emergency. Do not wait to see if the symptoms will go away. Get medical help right away. Call your local emergency services (911 in the [place] Do not drive yourself to the hospital. This information is not intended to replace advice given to you by your health care provider. Make sure you discuss any questions you have with your health care provider. Document Released: 03/21/2014 Document Revised: 12/21/2015 Document Reviewed: 01/30/2014 Elsevier Interactive Patient Education  2018 [employer]   Preeclampsia and Eclampsia Preeclampsia is a serious condition that develops only during pregnancy. It is also called toxemia of pregnancy. This condition causes high blood pressure along with other symptoms, such as swelling and headaches. These symptoms may develop as the condition gets worse. Preeclampsia may occur at 20 weeks of pregnancy or later. Diagnosing and treating preeclampsia early is very important. If not treated early, it can cause serious problems for you and your baby. One problem it can lead to is eclampsia, which is a condition that causes muscle jerking or shaking (convulsions or seizures) in the mother. Delivering your baby is the best treatment for preeclampsia or eclampsia. Preeclampsia and eclampsia symptoms usually go away after your baby is born. What are the causes? The cause of preeclampsia is not known. What increases the risk? The following risk factors make you more likely to  develop preeclampsia:  Being pregnant for the first time.  Having had preeclampsia during a past pregnancy.  Having a family history of preeclampsia.  Having high blood pressure.  Being pregnant with twins or triplets.  Being 8835 or older.  Being African-American.  Having kidney disease or diabetes.  Having medical conditions such as lupus or blood diseases.  Being very overweight (obese).  What are the signs or symptoms? The earliest signs of preeclampsia are:  High blood pressure.  Increased protein in your urine. Your health care provider will check for this at every visit before you give birth (prenatal visit).  Other symptoms that may develop as the condition gets worse include:  Severe headaches.  Sudden weight gain.  Swelling of the hands, face, legs, and feet.  Nausea and vomiting.  Vision problems, such as blurred or double vision.  Numbness in the face, arms, legs, and feet.  Urinating less than usual.  Dizziness.  Slurred speech.  Abdominal pain, especially upper abdominal pain.  Convulsions or seizures.  Symptoms generally go away after giving birth. How is this diagnosed? There are no screening tests for preeclampsia. Your health care provider will ask you about symptoms and check for signs of preeclampsia during your prenatal visits. You may also have tests that include:  Urine tests.  Blood tests.  Checking your blood pressure.  Monitoring your babys heart rate.  Ultrasound.  How is this treated? You and your health care provider will determine the treatment approach that is best for you. Treatment may  include:  Having more frequent prenatal exams to check for signs of preeclampsia, if you have an increased risk for preeclampsia.  Bed rest.  Reducing how much salt (sodium) you eat.  Medicine to lower your blood pressure.  Staying in the hospital, if your condition is severe. There, treatment will focus on controlling your  blood pressure and the amount of fluids in your body (fluid retention).  You may need to take medicine (magnesium sulfate) to prevent seizures. This medicine may be given as an injection or through an IV tube.  Delivering your baby early, if your condition gets worse. You may have your labor started with medicine (induced), or you may have a cesarean delivery.  Follow these instructions at home: Eating and drinking   Drink enough fluid to keep your urine clear or pale yellow.  Eat a healthy diet that is low in sodium. Do not add salt to your food. Check nutrition labels to see how much sodium a food or beverage contains.  Avoid caffeine. Lifestyle  Do not use any products that contain nicotine or tobacco, such as cigarettes and e-cigarettes. If you need help quitting, ask your health care provider.  Do not use alcohol or drugs.  Avoid stress as much as possible. Rest and get plenty of sleep. General instructions  Take over-the-counter and prescription medicines only as told by your health care provider.  When lying down, lie on your side. This keeps pressure off of your baby.  When sitting or lying down, raise (elevate) your feet. Try putting some pillows underneath your lower legs.  Exercise regularly. Ask your health care provider what kinds of exercise are best for you.  Keep all follow-up and prenatal visits as told by your health care provider. This is important. How is this prevented? To prevent preeclampsia or eclampsia from developing during another pregnancy:  Get proper medical care during pregnancy. Your health care provider may be able to prevent preeclampsia or diagnose and treat it early.  Your health care provider may have you take a low-dose aspirin or a calcium supplement during your next pregnancy.  You may have tests of your blood pressure and kidney function after giving birth.  Maintain a healthy weight. Ask your health care provider for help managing  weight gain during pregnancy.  Work with your health care provider to manage any long-term (chronic) health conditions you have, such as diabetes or kidney problems.  Contact a health care provider if:  You gain more weight than expected.  You have headaches.  You have nausea or vomiting.  You have abdominal pain.  You feel dizzy or light-headed. Get help right away if:  You develop sudden or severe swelling anywhere in your body. This usually happens in the legs.  You gain 5 lbs (2.3 kg) or more during one week.  You have severe: ? Abdominal pain. ? Headaches. ? Dizziness. ? Vision problems. ? Confusion. ? Nausea or vomiting.  You have a seizure.  You have trouble moving any part of your body.  You develop numbness in any part of your body.  You have trouble speaking.  You have any abnormal bleeding.  You pass out. This information is not intended to replace advice given to you by your health care provider. Make sure you discuss any questions you have with your health care provider. Document Released: 07/15/2000 Document Revised: 03/15/2016 Document Reviewed: 02/22/2016 Elsevier Interactive Patient Education  2018 [employer].  [email or web address]

## 2018-06-28 NOTE — Progress Notes (Signed)
Called regarding some increased sedation.  BP normotensive, good UOP, reflexes +1 per nursing.  Will check magnesium level but based on UOP and admission GFR low risk of magnesium toxicity.

## 2018-06-28 NOTE — Progress Notes (Signed)
Subjective:  Feeling better, still having a mild headache.    Objective:  Vital signs in last 24 hours: Temp:  [97.7 F (36.5 C)-98.1 F (36.7 C)] 97.8 F (36.6 C) (11/28 0730) Pulse Rate:  [65-94] 74 (11/28 0837) Resp:  [15-19] 15 (11/28 0730) BP: (105-167)/(56-107) 124/89 (11/28 0837) SpO2:  [93 %-100 %] 100 % (11/28 0837) Weight:  [82.2 kg-83.5 kg] 82.2 kg (11/28 1610)     Intake/Output Summary (Last 24 hours) at 06/28/2018 0847 Last data filed at 06/28/2018 0830 Gross per 24 hour  Intake 5809.58 ml  Output 6150 ml  Net -340.42 ml    Vitals:   06/28/18 0630 06/28/18 0652 06/28/18 0730 06/28/18 0837  BP: 130/85  127/74 124/89  Pulse: 74  76 74  Resp: 17  15   Temp:   97.8 F (36.6 C)   TempSrc:   Oral   SpO2: 97%  99% 100%  Weight:  82.2 kg    Height:         General: NAD Pulmonary: no increased work of breathing Extremities: pedal edema  Results for orders placed or performed during the hospital encounter of 06/27/18 (from the past 72 hour(s))  Protein / creatinine ratio, urine     Status: None   Collection Time: 06/27/18  1:24 PM  Result Value Ref Range   Creatinine, Urine 132 mg/dL   Total Protein, Urine 6 mg/dL    Comment: NO NORMAL RANGE ESTABLISHED FOR THIS TEST   Protein Creatinine Ratio 0.05 0.00 - 0.15 mg/mg[Cre]    Comment: Performed at [facility], [address] [place], [place] [address]  Comprehensive metabolic panel     Status: None   Collection Time: 06/27/18  1:49 PM  Result Value Ref Range   Sodium 140 135 - 145 mmol/L   Potassium 3.5 3.5 - 5.1 mmol/L   Chloride 107 98 - 111 mmol/L   CO2 24 22 - 32 mmol/L   Glucose, Bld 89 70 - 99 mg/dL   BUN 9 6 - 20 mg/dL   Creatinine, Ser 4.09 0.44 - 1.00 mg/dL   Calcium 8.9 8.9 - 81.1 mg/dL   Total Protein 6.7 6.5 - 8.1 g/dL   Albumin 3.7 3.5 - 5.0 g/dL   AST 24 15 - 41 U/L   ALT 21 0 - 44 U/L   Alkaline Phosphatase 100 38 - 126 U/L   Total Bilirubin 0.8 0.3 - 1.2 mg/dL   GFR  calc non Af Amer >60 >60 mL/min   GFR calc Af Amer >60 >60 mL/min   Anion gap 9 5 - 15    Comment: Performed at [facility], [address] [place], [place] [address]  CBC     Status: Abnormal   Collection Time: 06/27/18  1:49 PM  Result Value Ref Range   WBC 7.9 4.0 - 10.5 K/uL   RBC 4.42 3.87 - 5.11 MIL/uL   Hemoglobin 12.2 12.0 - 15.0 g/dL   HCT 29.5 (L) 62.1 - 30.8 %   MCV 77.8 (L) 80.0 - 100.0 fL   MCH 27.6 26.0 - 34.0 pg   MCHC 35.5 30.0 - 36.0 g/dL   RDW 65.7 84.6 - 96.2 %   Platelets 302 150 - 400 K/uL   nRBC 0.0 0.0 - 0.2 %    Comment: Performed at [facility], [address] [place], [place] [address]  Type and screen [facility]     Status: None   Collection  Time: 06/27/18  1:49 PM  Result Value Ref Range   ABO/RH(D) AB POS    Antibody Screen NEG    Sample Expiration      06/30/2018 Performed at [facility], [address] [place], [place] [address]     Assessment:   27 y.o. [phone] 2 weeks postpartum admitted for postpartum preeclampsia with severe features by BP and neurologic criteria  Plan:    1) Preeclampsia with severe features  - no laboratory abnormalities  - BP normotensive after initial severe range BP on admission - Continue magnesium for completion of 24-hr course  2) Disposition - anticipate discharge later this afternoon  Vena AustriaAndreas Karys Meckley, MD, Merlinda FrederickFACOG [facility], [facility] 06/28/2018, 8:41 AM

## 2018-06-28 NOTE — Discharge Planning (Addendum)
Pt. discharged home per provider order. Vitals stable. Discussed discharge instructions and plan of care with patient. Patient verbalized understanding.

## 2018-06-28 NOTE — Discharge Summary (Signed)
Physician Discharge Summary  Patient ID: Mary Curry MRN: [id number] DOB/AGE: 03-03-1991 27 y.o.  Admit date: 06/27/2018 Discharge date: 06/28/2018  Admission Diagnoses: Pre-eclampsia postpartum period  Discharge Diagnoses:  Active Problems:   Pre-eclampsia in postpartum period   Discharged Condition: good  Hospital Course: 27 year old G3P1021 s/p uncomplicated TSVD on 06/12/2018.  Her prenatal care, delivery, and immediate postpartum course were uncomplicated.  She started developing elevated blood pressure int he postpartum period noted at time of nursing home visit as well as an intractable headache prompting presentation to clinic.  BP was elevated in clinic and was admitted for a 24-hr course of magnesium sulfate for preeclampsia with severe features based on neurologic criteria (BP also in severe range on initial presentation to L&D).  BP normalized through the course of the admission with resolution of headache.  Laboratory evaluation did not reveal any abnormalities concerning for HELLP.  She will have close interval follow up 07/02/18 for BP check.  Consults: None  Significant Diagnostic Studies:  Results for orders placed or performed during the hospital encounter of 06/27/18 (from the past 48 hour(s))  Protein / creatinine ratio, urine     Status: None   Collection Time: 06/27/18  1:24 PM  Result Value Ref Range   Creatinine, Urine 132 mg/dL   Total Protein, Urine 6 mg/dL    Comment: NO NORMAL RANGE ESTABLISHED FOR THIS TEST   Protein Creatinine Ratio 0.05 0.00 - 0.15 mg/mg[Cre]    Comment: Performed at [facility], [address] [place], [place] [address]  Comprehensive metabolic panel     Status: None   Collection Time: 06/27/18  1:49 PM  Result Value Ref Range   Sodium 140 135 - 145 mmol/L   Potassium 3.5 3.5 - 5.1 mmol/L   Chloride 107 98 - 111 mmol/L   CO2 24 22 - 32 mmol/L   Glucose, Bld 89 70 - 99 mg/dL   BUN 9 6 - 20 mg/dL   Creatinine, Ser 2.13  0.44 - 1.00 mg/dL   Calcium 8.9 8.9 - 08.6 mg/dL   Total Protein 6.7 6.5 - 8.1 g/dL   Albumin 3.7 3.5 - 5.0 g/dL   AST 24 15 - 41 U/L   ALT 21 0 - 44 U/L   Alkaline Phosphatase 100 38 - 126 U/L   Total Bilirubin 0.8 0.3 - 1.2 mg/dL   GFR calc non Af Amer >60 >60 mL/min   GFR calc Af Amer >60 >60 mL/min   Anion gap 9 5 - 15    Comment: Performed at [facility], [address] [place], [place] [address]  CBC     Status: Abnormal   Collection Time: 06/27/18  1:49 PM  Result Value Ref Range   WBC 7.9 4.0 - 10.5 K/uL   RBC 4.42 3.87 - 5.11 MIL/uL   Hemoglobin 12.2 12.0 - 15.0 g/dL   HCT 96.2 (L) 95.2 - 84.1 %   MCV 77.8 (L) 80.0 - 100.0 fL   MCH 27.6 26.0 - 34.0 pg   MCHC 35.5 30.0 - 36.0 g/dL   RDW 32.4 40.1 - 02.7 %   Platelets 302 150 - 400 K/uL   nRBC 0.0 0.0 - 0.2 %    Comment: Performed at [facility], [address] [place], [place] [address]  Type and screen [facility]     Status: None   Collection Time: 06/27/18  1:49 PM  Result Value Ref Range   ABO/RH(D) AB  POS    Antibody Screen NEG    Sample Expiration      06/30/2018 Performed at [facility], [address] [place], [place] [address]   Magnesium     Status: Abnormal   Collection Time: 06/28/18 11:24 AM  Result Value Ref Range   Magnesium 5.7 (H) 1.7 - 2.4 mg/dL    Comment: Performed at [facility], [address] [place], [place] [address]     Treatments: IV magnesium sulfate  Discharge Exam: Blood pressure 122/84, pulse 74, temperature 98.1 F (36.7 C), temperature source Oral, resp. rate 18, height 5\' 5"  (1.651 m), weight 82.2 kg, last menstrual period 09/21/2017, SpO2 97 %, unknown if currently breastfeeding. General appearance: alert, appears stated age and no distress Resp: clear to auscultation bilaterally Cardio: regular rate and rhythm, S1, S2 normal, no murmur, click, rub or gallop GI: soft, non-tender Extremities: extremities  normal, atraumatic, no cyanosis or edema Neurologic: Reflexes: 2+ and symmetric  Disposition: Discharge disposition: 01-Home or Self Care       Discharge Instructions    Activity as tolerated   Complete by:  As directed    Call MD for:   Complete by:  As directed    For heavy vaginal bleeding greater than 1 pad an hour   Call MD for:  difficulty breathing, headache or visual disturbances   Complete by:  As directed    Call MD for:  extreme fatigue   Complete by:  As directed    Call MD for:  hives   Complete by:  As directed    Call MD for:  persistant dizziness or light-headedness   Complete by:  As directed    Call MD for:  persistant nausea and vomiting   Complete by:  As directed    Call MD for:  severe uncontrolled pain   Complete by:  As directed    Call MD for:  temperature >100.4   Complete by:  As directed    Diet general   Complete by:  As directed    Sexual acrtivity   Complete by:  As directed    No intercourse, tampons, or anything vaginally for 6 weeks     Allergies as of 06/28/2018   No Known Allergies     Medication List    STOP taking these medications   sertraline 50 MG tablet Commonly known as:  ZOLOFT     TAKE these medications   ibuprofen 600 MG tablet Commonly known as:  ADVIL,MOTRIN Take 1 tablet (600 mg total) by mouth every 6 (six) hours.   PRENATE MINI 18-0.6-0.4-350 MG Caps Take 1 tablet by mouth daily.      Follow-up Information    [name], Gabriela Giannelli, MD. Go on 07/02/2018.   Specialty:  Obstetrics and Gynecology Why:  BP check Contact information: [address] [place] [address] [phone]           Signed: Vena Austriandreas Manuella Blackson 06/28/2018, 2:04 PM

## 2018-06-28 NOTE — Progress Notes (Signed)
During 1030 patient assessment, RN noted decreased reflexes and new onset drowsiness. Vitals within normal limits. Urine output appropriate. Discussed change in patient status with provider. Provider to check mag level. Will continue to monitor.

## 2018-07-27 ENCOUNTER — Encounter: Payer: Self-pay | Admitting: Obstetrics and Gynecology

## 2018-07-27 ENCOUNTER — Ambulatory Visit (INDEPENDENT_AMBULATORY_CARE_PROVIDER_SITE_OTHER): Payer: Medicaid Other | Admitting: Obstetrics and Gynecology

## 2018-07-27 ENCOUNTER — Telehealth: Payer: Self-pay | Admitting: Obstetrics and Gynecology

## 2018-07-27 DIAGNOSIS — Z1389 Encounter for screening for other disorder: Secondary | ICD-10-CM | POA: Diagnosis not present

## 2018-07-27 DIAGNOSIS — O9122 Nonpurulent mastitis associated with the puerperium: Secondary | ICD-10-CM

## 2018-07-27 MED ORDER — DICLOXACILLIN SODIUM 500 MG PO CAPS: 500.0000 mg | ORAL_CAPSULE | Freq: Four times a day (QID) | ORAL | 0 refills | Status: AC

## 2018-07-27 NOTE — Progress Notes (Signed)
Postpartum Visit  Chief Complaint:  Chief Complaint  Patient presents with  . Postpartum Care    leg pain,pt always feels cold  . Headache  . Breast Pain    left breast     History of Present Illness: Patient is a 27 y.o. [id number] presents for postpartum visit.   Review the Delivery Report for details.  Delivery Note At 7:47 PM a viable female was delivered via Vaginal, Spontaneous APGAR: 8, 9; weight 5 lb 12.4 oz (2620 g).    Episiotomy No.  Laceration: no  Pregnancy or labor problems:  no Any problems since the delivery:  Yes postpartum preeclampsia with re-admission for magnesium sulfate  Maternal Details:  Breast or formula feeding: plans to breastfeed Contraception after delivery: No  Any bowel or bladder issues: No  Post partum depression/anxiety noted:  no Edinburgh Post-Partum Depression Score:3 Date of last PAP: 11/08/2017  no abnormalities   Review of Systems: Review of Systems  Constitutional: Negative.   Gastrointestinal: Negative.   Genitourinary: Negative.   Skin: Negative.   Neurological: Positive for headaches.    The following portions of the patient's history were reviewed and updated as appropriate: allergies, current medications, past family history, past medical history, past social history, past surgical history and problem list.  Past Medical History:  Past Medical History:  Diagnosis Date  . Anemia   . 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 Miscarriage     Past Surgical History:  Past Surgical History:  Procedure Laterality Date  . DILATION AND CURETTAGE OF UTERUS N/A 06/29/2017   Procedure: DILATATION AND CURETTAGE;  Surgeon: [name], Luvern Mischke, MD;  Location: [facility] ORS;  Service: Gynecology;  Laterality: N/A;  . NO PAST SURGERIES      Family History:  Family History  Problem Relation Age of Onset  . Hypertension Mother     Social History:  Social History   Socioeconomic History  . Marital status: Single    Spouse name:  Not on file  . Number of children: Not on file  . Years of education: Not on file  . Highest education level: Not on file  Occupational History  . Not on file  Social Needs  . Financial resource strain: Not hard at all  . Food insecurity:    Worry: Never true    Inability: Never true  . Transportation needs:    Medical: No    Non-medical: No  Tobacco Use  . Smoking status: Never Smoker  . Smokeless tobacco: Never Used  Substance and Sexual Activity  . Alcohol use: No  . Drug use: No  . Sexual activity: Not Currently    Birth control/protection: None  Lifestyle  . Physical activity:    Days per week: 0 days    Minutes per session: 0 min  . Stress: Not at all  Relationships  . Social connections:    Talks on phone: Three times a week    Gets together: Three times a week    Attends religious service: Never    Active member of club or organization: No    Attends meetings of clubs or organizations: Never    Relationship status: Not on file  . Intimate partner violence:    Fear of current or ex partner: No    Emotionally abused: No    Physically abused: No    Forced sexual activity: No  Other Topics Concern  . Not on file  Social History Narrative  . Not on file  Allergies:  No Known Allergies  Medications: Prior to Admission medications   Medication Sig Start Date End Date Taking? Authorizing Provider  ibuprofen (ADVIL,MOTRIN) 600 MG tablet Take 1 tablet (600 mg total) by mouth every 6 (six) hours. Patient not taking: Reported on 07/27/2018 06/14/18   Donette LarryBhambri, Melanie, CNM  Prenat-FeCbn-FeAsp-Meth-FA-DHA (PRENATE MINI) 18-0.6-0.4-350 MG CAPS Take 1 tablet by mouth daily. Patient not taking: Reported on 07/27/2018 11/08/17   Conard NovakJackson, Stephen D, MD    Physical Exam Blood pressure 100/70, height 5\' 5"  (1.651 m), weight 179 lb (81.2 kg), last menstrual period 09/21/2017, unknown if currently breastfeeding.    General: NAD HEENT: normocephalic,  anicteric Pulmonary: No increased work of breathing Breast symmetrical, right breast tenderness, no palpable nodules or masses, no skin or nipple retraction present, no nipple discharge.  There is some mild erythema on the lateral aspect of the right breast and warmth.  No axillary or supraclavicular lymphadenopathy. Abdomen: soft, non-tender, non-distended.  Umbilicus without lesions.  No hepatomegaly, splenomegaly or masses palpable. No evidence of hernia. Genitourinary:  External: Normal external female genitalia.  Normal urethral meatus, normal  Bartholin's and Skene's glands.    Vagina: Normal vaginal mucosa, no evidence of prolapse.    Cervix: Grossly normal in appearance, no bleeding  Uterus: Non-enlarged, mobile, normal contour.  No CMT  Adnexa: ovaries non-enlarged, no adnexal masses  Rectal: deferred Extremities: no edema, erythema, or tenderness Neurologic: Grossly intact Psychiatric: mood appropriate, affect full  Edinburgh Postnatal Depression Scale - 07/27/18 1000      Edinburgh Postnatal Depression Scale:  In the Past 7 Days   I have been able to laugh and see the funny side of things.  0    I have looked forward with enjoyment to things.  0    I have blamed myself unnecessarily when things went wrong.  0    I have been anxious or worried for no good reason.  1    I have felt scared or panicky for no good reason.  0    Things have been getting on top of me.  1    I have been so unhappy that I have had difficulty sleeping.  0    I have felt sad or miserable.  0    I have been so unhappy that I have been crying.  0    The thought of harming myself has occurred to me.  0    Edinburgh Postnatal Depression Scale Total  2       Assessment: 27 y.o. [id number] presenting for 6 week postpartum visit  Plan: Problem List Items Addressed This Visit    None    Visit Diagnoses    6 weeks postpartum follow-up    -  Primary   Mastitis associated with childbirth, delivered            1) Contraception - Education given regarding options for contraception, as well as compatibility with breast feeding if applicable.  Patient plans on Nexplanon for contraception.  2)  Pap - ASCCP guidelines and rational discussed.  ASCCP guidelines and rational discussed.  Patient opts for every 3 years screening interval  3) Patient underwent screening for postpartum depression with no signs of depression  4) Right mastitis - rx dicloxacillin  5) Return in about 10 days (around 08/06/2018) for nexplanon insertion.   Vena AustriaAndreas Margaretmary Prisk, MD, Evern CoreFACOG [facility], [facility] 07/27/2018, 10:55 AM

## 2018-07-27 NOTE — Telephone Encounter (Signed)
Noted. Will order to arrive by apt date/time. 

## 2018-07-27 NOTE — Telephone Encounter (Signed)
Pt coming for nexplanon on 1/7 at 310 with AMS

## 2018-07-30 ENCOUNTER — Ambulatory Visit: Payer: Medicaid Other | Admitting: Obstetrics and Gynecology

## 2018-08-06 NOTE — Telephone Encounter (Signed)
Nexplanon reserved for this patient. 

## 2018-08-07 ENCOUNTER — Ambulatory Visit: Payer: Medicaid Other | Admitting: Obstetrics and Gynecology

## 2018-08-10 NOTE — Telephone Encounter (Signed)
Patient is schedule for nexplanon insertion 08/13/18 at 2:50 with Dr. Tiburcio Pea

## 2018-08-13 ENCOUNTER — Ambulatory Visit: Payer: Medicaid Other | Admitting: Obstetrics & Gynecology

## 2018-08-14 NOTE — Telephone Encounter (Signed)
Patient no showed schedule appt 08/13/17 with RPH. Patient is reschedule with AMS 08/22/18 at 1:50

## 2018-08-22 ENCOUNTER — Encounter: Payer: Self-pay | Admitting: Obstetrics & Gynecology

## 2018-08-22 ENCOUNTER — Ambulatory Visit (INDEPENDENT_AMBULATORY_CARE_PROVIDER_SITE_OTHER): Payer: Medicaid Other | Admitting: Obstetrics & Gynecology

## 2018-08-22 VITALS — BP 120/88 | Ht 65.0 in | Wt 180.0 lb

## 2018-08-22 DIAGNOSIS — Z3049 Encounter for surveillance of other contraceptives: Secondary | ICD-10-CM | POA: Diagnosis not present

## 2018-08-22 DIAGNOSIS — Z30017 Encounter for initial prescription of implantable subdermal contraceptive: Secondary | ICD-10-CM

## 2018-08-22 NOTE — Progress Notes (Signed)
  Nexplanon Insertion  Patient given informed consent, signed copy in the chart, time out was performed. Appropriate time out taken.  Patient's LEFT arm was prepped and draped in the usual sterile fashion.. The ruler used to measure and mark insertion area.  Pt was prepped with betadine swab and then injected with 3 cc of 2% lidocaine with epinephrine. Nexplanon removed form packaging,  Device confirmed in needle, then inserted full length of needle and withdrawn per handbook instructions.  Pt insertion site covered with steri-strip and a bandage.   Minimal blood loss.  Pt tolerated the procedure well.   Annamarie Major, MD, [facility], [facility] 08/22/2018  2:08 PM

## 2018-08-22 NOTE — Patient Instructions (Signed)
Nexplanon Instructions After Insertion  Keep bandage clean and dry for 24 hours  May use ice/Tylenol/Ibuprofen for soreness or pain  If you develop fever, drainage or increased warmth from incision site-contact office immediately   

## 2018-09-12 IMAGING — US US OB LIMITED
1 series · 13 of 13 positions shown · non-contrast
Comparison: none

CLINICAL DATA: Cramping for 2 days. Estimated gestational age by
LMP is 19 weeks.

EXAM:
LIMITED OBSTETRIC ULTRASOUND

[Series 1: us ob limited · 13 of 13 slices shown]
[im 1/13]
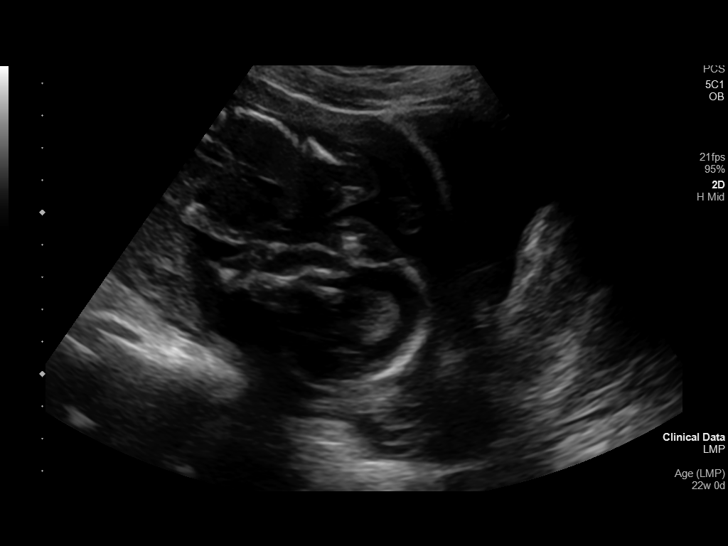
[im 2/13]
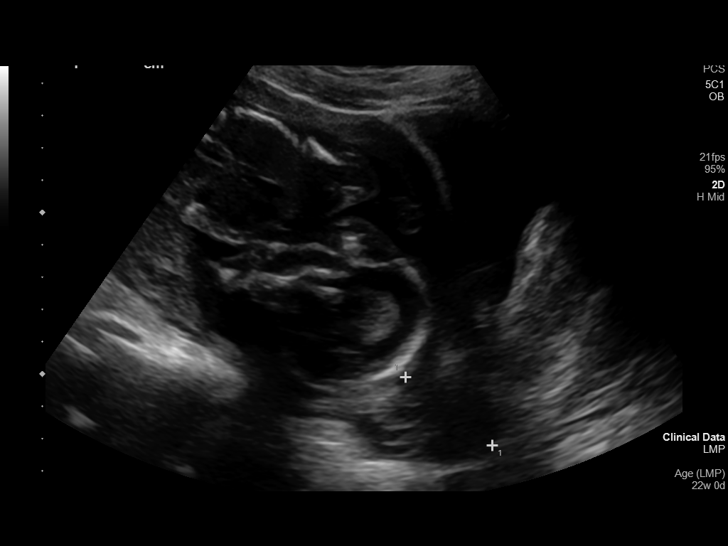
[im 3/13]
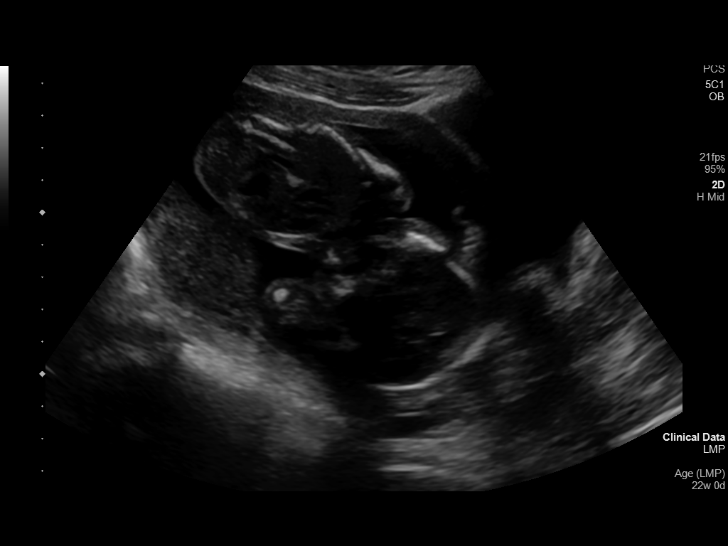
[im 4/13]
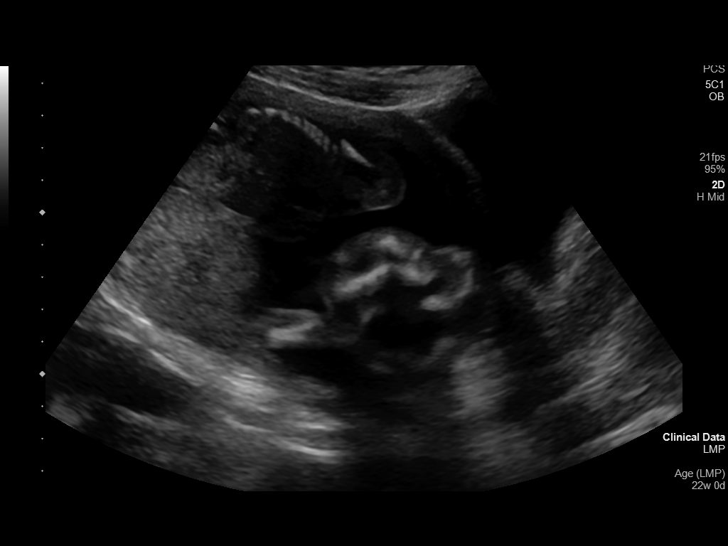
[im 5/13]
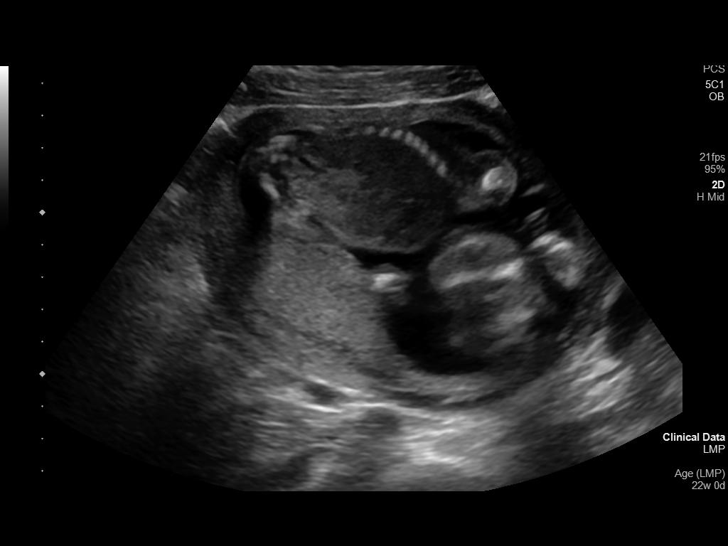
[im 6/13]
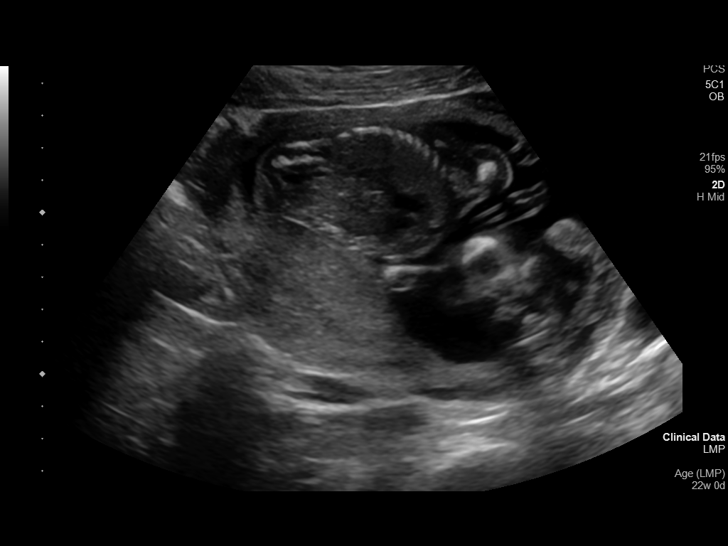
[im 7/13]
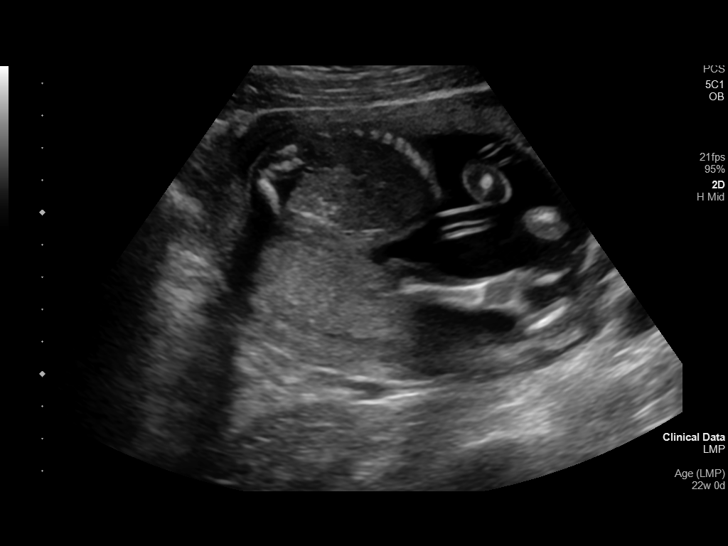
[im 8/13]
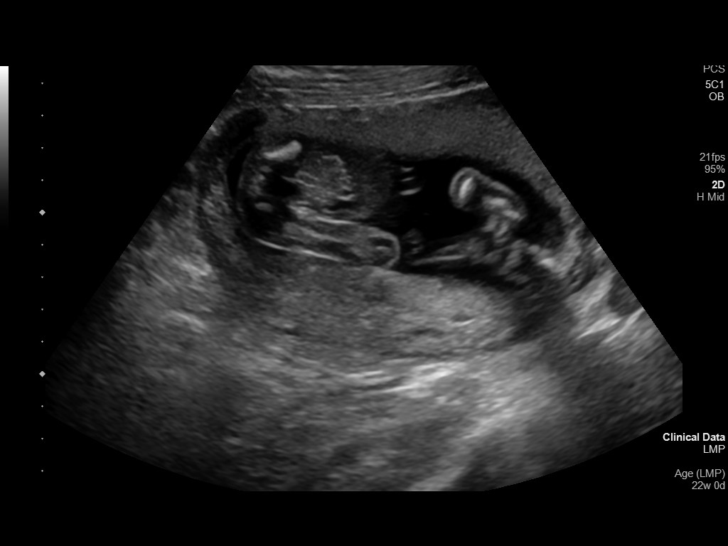
[im 9/13]
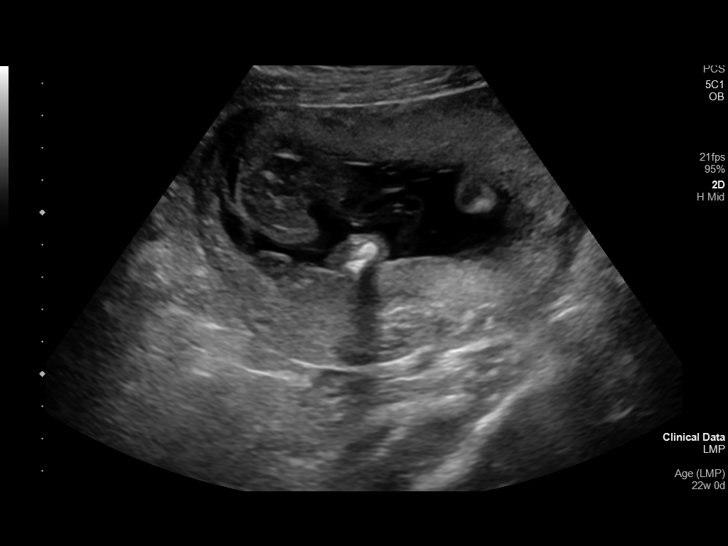
[im 10/13]
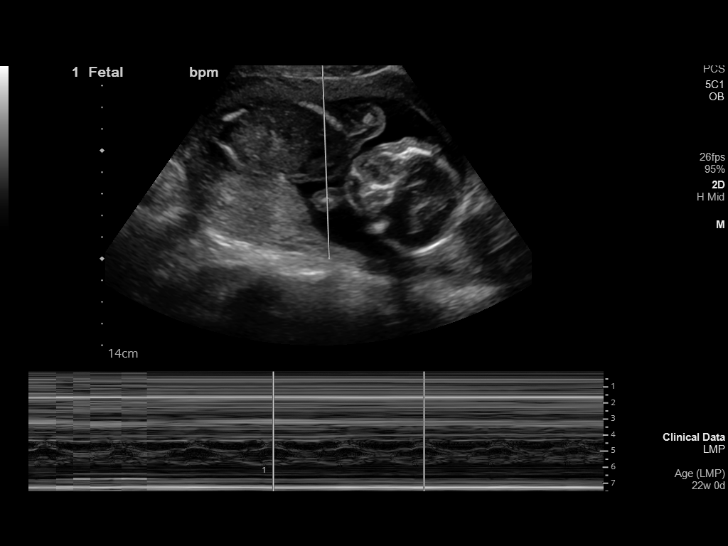
[im 11/13]
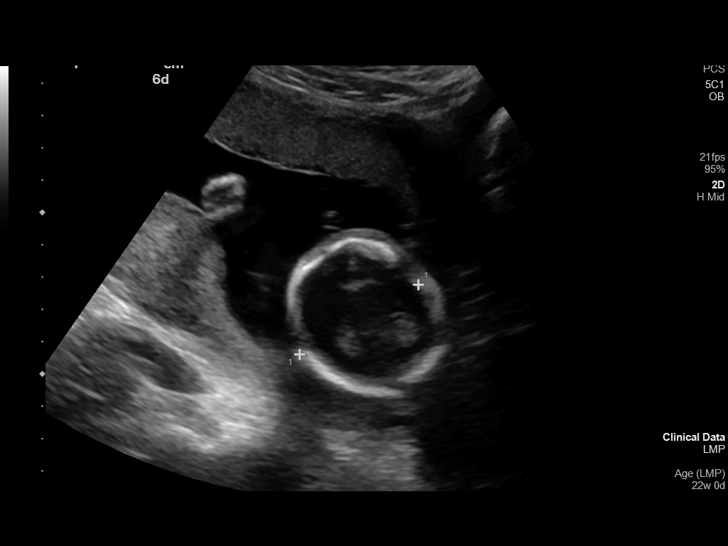
[im 12/13]
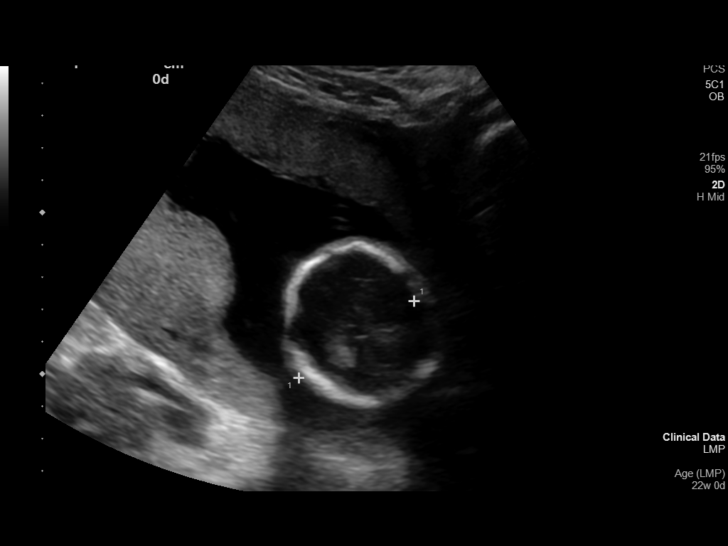
[im 13/13]
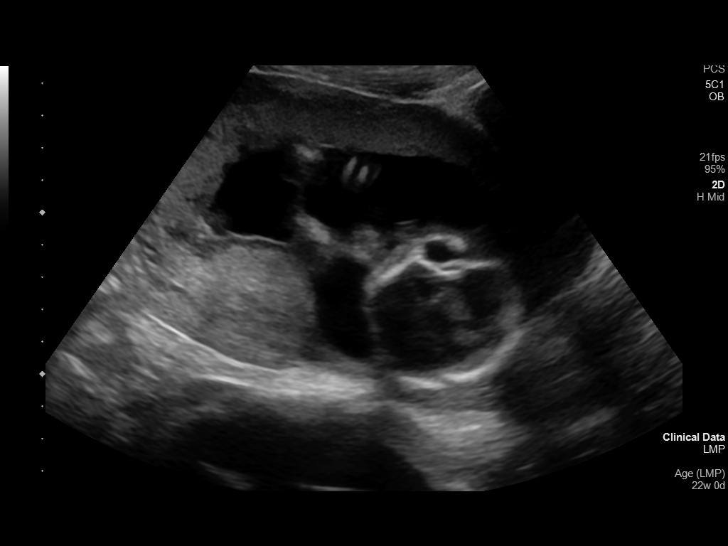

[13 of 13 positions shown; findings below may reference images not displayed]

FINDINGS: Number of Fetuses: 1

Heart Rate:  136 bpm

Movement: Fetal movement is observed.

Presentation: Cephalic presentation.

Placental Location: Posterior placenta.

Previa: No previa.

Amniotic Fluid (Subjective):  Within normal limits.

BPD: 4.3 cm 19 w  0 d

MATERNAL FINDINGS:

Cervix:  Cervical length measures 3.4 cm.  Cervix appears closed.

Uterus/Adnexae: Limited visualization. No focal abnormalities
identified.
IMPRESSION: Single intrauterine pregnancy with cephalic presentation and
posterior placenta. Fetal motion and cardiac activity are observed.
No acute complication is demonstrated on limited imaging.

This exam is performed on an emergent basis and does not
comprehensively evaluate fetal size, dating, or anatomy; follow-up
complete OB US should be considered if further fetal assessment is
warranted.

## 2018-11-21 ENCOUNTER — Ambulatory Visit: Payer: Medicaid Other | Admitting: Obstetrics and Gynecology

## 2018-11-21 ENCOUNTER — Telehealth: Payer: Self-pay

## 2018-11-21 NOTE — Telephone Encounter (Signed)
Breast Feeding Accommodation papers filled out, signature obtained and given to [place] for processing.

## 2018-11-26 DIAGNOSIS — O9122 Nonpurulent mastitis associated with the puerperium: Secondary | ICD-10-CM

## 2019-04-04 ENCOUNTER — Telehealth: Payer: Self-pay

## 2019-04-04 NOTE — Telephone Encounter (Signed)
Left voicemail for patient regarding breast pain with recommendations for continued nursing/pumping on the affected side, warm compresses and to gently massage the affected area to release clogged duct. Advised if the area becomes hard, red, hot, if she develops a fever we will prescribe abx. Also important for patient to drink plenty of water, get extra rest and take tylenol/ibuprofen for pain.

## 2019-04-04 NOTE — Telephone Encounter (Signed)
Patient states a few months ago she developed a small lump/knot in her breast & AMS sent her in a rx. She is now experiencing pain again in one breast and is unable to breastfeed on that side due to the pain x 2 days. Inquiring if she can get a refill of the rx or if she needs to be seen. [phone]

## 2019-04-04 NOTE — Telephone Encounter (Signed)
LMVM TRC. Advised to ask for Jennelle Pinkstaff or can leave details on Triage vm regarding if she has had fever and to verify pharmacy [facility].

## 2019-04-04 NOTE — Telephone Encounter (Signed)
Patient states it is under her right breast (no specific lump/knot). It is warm to touch. She hasn't had any fever/chills, flu like symptoms. Has tried to pump but it hurts & hurts when baby latches on. She has just avoided feeding on that side.

## 2019-04-22 ENCOUNTER — Ambulatory Visit: Payer: Medicaid Other | Admitting: Obstetrics and Gynecology

## 2019-12-17 ENCOUNTER — Other Ambulatory Visit: Payer: Self-pay

## 2019-12-17 ENCOUNTER — Encounter (HOSPITAL_COMMUNITY): Payer: Self-pay

## 2019-12-17 ENCOUNTER — Emergency Department (HOSPITAL_COMMUNITY)
Admission: EM | Admit: 2019-12-17 | Discharge: 2019-12-17 | Disposition: A | Discharge status: Home / Self Care | Payer: PRIVATE HEALTH INSURANCE | Attending: Emergency Medicine | Admitting: Emergency Medicine

## 2019-12-17 DIAGNOSIS — R112 Nausea with vomiting, unspecified: Secondary | ICD-10-CM | POA: Insufficient documentation

## 2019-12-17 LAB — URINALYSIS, ROUTINE W REFLEX MICROSCOPIC
Bilirubin Urine: NEGATIVE
Glucose, UA: NEGATIVE mg/dL
Hgb urine dipstick: NEGATIVE
Ketones, ur: NEGATIVE mg/dL
Leukocytes,Ua: NEGATIVE
Nitrite: NEGATIVE
Protein, ur: NEGATIVE mg/dL
Specific Gravity, Urine: 1.018 (ref 1.005–1.030)
pH: 6 (ref 5.0–8.0)

## 2019-12-17 LAB — COMPREHENSIVE METABOLIC PANEL
ALT: 17 U/L (ref 0–44)
AST: 17 U/L (ref 15–41)
Albumin: 4.1 g/dL (ref 3.5–5.0)
Alkaline Phosphatase: 48 U/L (ref 38–126)
Anion gap: 7 (ref 5–15)
BUN: 7 mg/dL (ref 6–20)
CO2: 25 mmol/L (ref 22–32)
Calcium: 8.5 mg/dL — ABNORMAL LOW (ref 8.9–10.3)
Chloride: 105 mmol/L (ref 98–111)
Creatinine, Ser: 0.59 mg/dL (ref 0.44–1.00)
GFR calc Af Amer: >60 mL/min (ref >60)
GFR calc non Af Amer: >60 mL/min (ref >60)
Glucose, Bld: 85 mg/dL (ref 70–99)
Potassium: 3.5 mmol/L (ref 3.5–5.1)
Sodium: 137 mmol/L (ref 135–145)
Total Bilirubin: 0.7 mg/dL (ref 0.3–1.2)
Total Protein: 6.9 g/dL (ref 6.5–8.1)

## 2019-12-17 LAB — CBC
HCT: 34.9 % — ABNORMAL LOW (ref 36.0–46.0)
Hemoglobin: 12.5 g/dL (ref 12.0–15.0)
MCH: 28.9 pg (ref 26.0–34.0)
MCHC: 35.8 g/dL (ref 30.0–36.0)
MCV: 80.6 fL (ref 80.0–100.0)
Platelets: 281 10*3/uL (ref 150–400)
RBC: 4.33 MIL/uL (ref 3.87–5.11)
RDW: 13.8 % (ref 11.5–15.5)
WBC: 10.2 10*3/uL (ref 4.0–10.5)
nRBC: 0 % (ref 0.0–0.2)

## 2019-12-17 LAB — HCG, QUANTITATIVE, PREGNANCY: hCG, Beta Chain, Quant, S: 1 m[IU]/mL (ref <5)

## 2019-12-17 LAB — LIPASE, BLOOD: Lipase: 27 U/L (ref 11–51)

## 2019-12-17 MED ORDER — ONDANSETRON 4 MG PO TBDP
4.0000 mg | ORAL_TABLET | Freq: Three times a day (TID) | ORAL | 0 refills | Status: DC | PRN
Start: 2019-12-17 — End: 2021-04-02

## 2019-12-17 MED ORDER — SODIUM CHLORIDE 0.9 % IV BOLUS: 1000.0000 mL | Freq: Once | INTRAVENOUS | Status: DC

## 2019-12-17 MED ORDER — SODIUM CHLORIDE 0.9% FLUSH: 3.0000 mL | Freq: Once | INTRAVENOUS | Status: DC

## 2019-12-17 MED ORDER — ONDANSETRON HCL 4 MG/2ML IJ SOLN: 4.0000 mg | Freq: Once | INTRAMUSCULAR | Status: DC

## 2019-12-17 NOTE — ED Triage Notes (Signed)
Patient c/o emesis x 5 days. Patient states she did have abdominal pain, but not now. Patient denies diarrhea.

## 2019-12-17 NOTE — ED Provider Notes (Signed)
[facility]-EMERGENCY DEPT Provider Note   CSN: [id number] Arrival date & time: 12/17/19  1323     History Chief Complaint  Patient presents with  . Emesis    Mary Curry is a 29 y.o. female with a past medical history significant for anemia who presents to the ED due to numerous episodes of nonbloody, nonbilious emesis for the past 5 days.  Patient states her symptoms have gradually been improving over the past few days.  Patient admits to generalized abdominal pain that completely resolved on Sunday.  Denies vaginal and urinary symptoms.  Denies fever and chills.  Patient admits to diarrhea the first day her symptoms started; ever, none since.  Patient notes a GI illness is going around at work and she is required to get a work note prior to returning.  No previous abdominal operations.  No recent antibiotics or undercooked foods.  History obtained from patient and past medical records. No interpreter used during encounter.      Past Medical History:  Diagnosis Date  . Anemia   . 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 Miscarriage     Patient Active Problem List   Diagnosis Date Noted  . Pre-eclampsia in postpartum period 06/27/2018  . Full-term premature rupture of membranes 06/12/2018  . Encounter for induction of labor 06/12/2018  . Positive GBS test 06/01/2018  . Anemia affecting pregnancy in third trimester 04/10/2018  . 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 Supervision of low-risk pregnancy 11/08/2017  . Miscarriage within last 12 months 10/30/2017    Past Surgical History:  Procedure Laterality Date  . DILATION AND CURETTAGE OF UTERUS N/A 06/29/2017   Procedure: DILATATION AND CURETTAGE;  Surgeon: Vena Austria, MD;  Location: [facility] ORS;  Service: Gynecology;  Laterality: N/A;  . NO PAST SURGERIES       OB History    Gravida  3   Para  1   Term  1   Preterm      AB  2   Living  1     SAB  2   TAB      Ectopic     Multiple  0   Live Births  1           Family History  Problem Relation Age of Onset  . Hypertension Mother     Social History   Tobacco Use  . Smoking status: Never Smoker  . Smokeless tobacco: Never Used  Substance Use Topics  . Alcohol use: No  . Drug use: No    Home Medications Prior to Admission medications   Medication Sig Start Date End Date Taking? Authorizing Provider  Ascorbic Acid (VITAMIN C PO) Take 1 tablet by mouth daily.   Yes [provider]  Thiamine HCl (VITAMIN B-1 PO) Take 1 tablet by mouth daily.   Yes [provider]  VITAMIN D PO Take 1 tablet by mouth daily.   Yes [provider]  VITAMIN E PO Take 1 tablet by mouth daily.   Yes [provider]  ibuprofen (ADVIL,MOTRIN) 600 MG tablet Take 1 tablet (600 mg total) by mouth every 6 (six) hours. Patient not taking: Reported on 07/27/2018 06/14/18   Donette Larry, CNM  ondansetron (ZOFRAN ODT) 4 MG disintegrating tablet Take 1 tablet (4 mg total) by mouth every 8 (eight) hours as needed for nausea or vomiting. 12/17/19   Audelia Acton, Merla Riches, PA-C  Prenat-FeCbn-FeAsp-Meth-FA-DHA (PRENATE MINI) 18-0.6-0.4-350 MG CAPS Take 1 tablet by  mouth daily. Patient not taking: Reported on 12/17/2019 11/08/17   Conard Novak, MD    Allergies    Patient has no known allergies.  Review of Systems   Review of Systems  Constitutional: Negative for chills and fever.  Gastrointestinal: Positive for nausea and vomiting. Negative for abdominal pain and diarrhea.  Genitourinary: Negative for dysuria and vaginal discharge.  All other systems reviewed and are negative.   Physical Exam Updated Vital Signs BP 122/86 (BP Location: Left Arm)   Pulse 68   Temp 98.1 F (36.7 C) (Oral)   Resp 18   Ht 5\' 5"  (1.651 m)   Wt 70.8 kg   SpO2 100%   BMI 25.96 kg/m   Physical Exam Vitals and nursing note reviewed.  Constitutional:      General: She is not in acute distress.     Appearance: She is not ill-appearing.  HENT:     Head: Normocephalic.  Eyes:     Pupils: Pupils are equal, round, and reactive to light.  Cardiovascular:     Rate and Rhythm: Normal rate and regular rhythm.     Pulses: Normal pulses.     Heart sounds: Normal heart sounds. No murmur. No friction rub. No gallop.   Pulmonary:     Effort: Pulmonary effort is normal.     Breath sounds: Normal breath sounds.  Abdominal:     General: Abdomen is flat. Bowel sounds are normal. There is no distension.     Palpations: Abdomen is soft.     Tenderness: There is no abdominal tenderness. There is no right CVA tenderness, left CVA tenderness, guarding or rebound.     Comments: Abdomen soft, nondistended, nontender to palpation in all quadrants without guarding or peritoneal signs. No rebound.   Musculoskeletal:     Cervical back: Neck supple.     Comments: Able to move all 4 extremities without difficulty.   Skin:    General: Skin is warm and dry.  Neurological:     General: No focal deficit present.     Mental Status: She is alert.  Psychiatric:        Mood and Affect: Mood normal.        Behavior: Behavior normal.     ED Results / Procedures / Treatments   Labs (all labs ordered are listed, but only abnormal results are displayed) Labs Reviewed  COMPREHENSIVE METABOLIC PANEL - Abnormal; Notable for the following components:      Result Value   Calcium 8.5 (*)    All other components within normal limits  CBC - Abnormal; Notable for the following components:   HCT 34.9 (*)    All other components within normal limits  LIPASE, BLOOD  URINALYSIS, ROUTINE W REFLEX MICROSCOPIC  HCG, QUANTITATIVE, PREGNANCY    EKG None  Radiology No results found.  Procedures Procedures (including critical care time)  Medications Ordered in ED Medications  sodium chloride flush (NS) 0.9 % injection 3 mL (has no administration in time range)  sodium chloride 0.9 % bolus 1,000 mL (has no  administration in time range)  ondansetron (ZOFRAN) injection 4 mg (has no administration in time range)    ED Course  I have reviewed the triage vital signs and the nursing notes.  Pertinent labs & imaging results that were available during my care of the patient were reviewed by me and considered in my medical decision making (see chart for details).  Clinical Course as of Dec 17 2110  Tue Dec 17, 2019  2020 HCG, Beta Chain, Quant, S: 1 [CA]    Clinical Course User Index [CA] Suzy Bouchard, PA-C   MDM Rules/Calculators/A&P                     29 year old female presents to the ED due to nonbloody, nonbilious emesis x5 days.  Abdominal pain has completely resolved.  Denies fever and chills.  Upon arrival, vitals all within normal limits.  Patient is afebrile, not tachycardic or hypoxic.  Patient in no acute distress and non-ill-appearing.  Abdomen soft, nondistended, and nontender.  No concern for acute abdomen. Negative CVA tenderness bilaterally.  Routine labs and UA ordered at triage.  Will give IV fluids and IV Zofran for symptomatic relief.  CBC reassuring with no leukocytosis.  CMP reassuring with normal renal function no major electrolyte derangements.  Lipase normal at 27.  Doubt pancreatitis.  Negative pregnancy test. UA negative for signs of infection.   8:57 PM informed by RN that patient deferred IV fluids and IV Zofran.  Will discharge patient with zofran as needed for nausea. Advised patient to follow-up with PCP if symptoms do not improve. Work note provided. Patient able to tolerate po here in the ED. Strict ED precautions discussed with patient. Patient states understanding and agrees to plan. Patient discharged home in no acute distress and stable vitals. Final Clinical Impression(s) / ED Diagnoses Final diagnoses:  Non-intractable vomiting with nausea, unspecified vomiting type    Rx / DC Orders ED Discharge Orders         Ordered    ondansetron (ZOFRAN ODT)  4 MG disintegrating tablet  Every 8 hours PRN     12/17/19 2110           Karie Kirks 12/17/19 2118    Lacretia Leigh, MD 12/17/19 2309

## 2019-12-17 NOTE — ED Notes (Signed)
Patient upset about length of wait. Attempted to leave AMA but was stopped in the hallway and waited for discharge paperwork. Left without vitals or signing discharge

## 2019-12-17 NOTE — Discharge Instructions (Addendum)
As discussed, all of your labs were reassuring. I suck you had a GI bug that is improving. I am sending you home with nausea medication. Take as prescribed. Follow-up with PCP if symptoms do not improve within the next week. Return to the ER for new or worsening symptoms.

## 2020-08-14 DIAGNOSIS — Z1152 Encounter for screening for COVID-19: Secondary | ICD-10-CM | POA: Diagnosis not present

## 2020-09-02 NOTE — Telephone Encounter (Signed)
Nexplanon rcvd/charged 08/22/2018

## 2020-12-11 DIAGNOSIS — R69 Illness, unspecified: Secondary | ICD-10-CM | POA: Diagnosis not present

## 2020-12-11 DIAGNOSIS — E559 Vitamin D deficiency, unspecified: Secondary | ICD-10-CM | POA: Diagnosis not present

## 2020-12-11 DIAGNOSIS — R5383 Other fatigue: Secondary | ICD-10-CM | POA: Diagnosis not present

## 2020-12-11 DIAGNOSIS — Z114 Encounter for screening for human immunodeficiency virus [HIV]: Secondary | ICD-10-CM | POA: Diagnosis not present

## 2020-12-11 DIAGNOSIS — Z0000 Encounter for general adult medical examination without abnormal findings: Secondary | ICD-10-CM | POA: Diagnosis not present

## 2020-12-11 DIAGNOSIS — Z131 Encounter for screening for diabetes mellitus: Secondary | ICD-10-CM | POA: Diagnosis not present

## 2020-12-11 DIAGNOSIS — Z1159 Encounter for screening for other viral diseases: Secondary | ICD-10-CM | POA: Diagnosis not present

## 2021-04-02 ENCOUNTER — Other Ambulatory Visit (HOSPITAL_COMMUNITY)
Admission: RE | Admit: 2021-04-02 | Discharge: 2021-04-02 | Disposition: A | Discharge status: Home / Self Care | Payer: 59 | Source: Ambulatory Visit | Attending: Obstetrics and Gynecology | Admitting: Obstetrics and Gynecology

## 2021-04-02 ENCOUNTER — Other Ambulatory Visit: Payer: Self-pay

## 2021-04-02 ENCOUNTER — Encounter: Payer: Self-pay | Admitting: Obstetrics and Gynecology

## 2021-04-02 ENCOUNTER — Ambulatory Visit (INDEPENDENT_AMBULATORY_CARE_PROVIDER_SITE_OTHER): Payer: 59 | Admitting: Obstetrics and Gynecology

## 2021-04-02 VITALS — BP 118/68 | HR 74 | Ht 65.0 in | Wt 140.0 lb

## 2021-04-02 DIAGNOSIS — B9689 Other specified bacterial agents as the cause of diseases classified elsewhere: Secondary | ICD-10-CM | POA: Diagnosis not present

## 2021-04-02 DIAGNOSIS — Z1239 Encounter for other screening for malignant neoplasm of breast: Secondary | ICD-10-CM | POA: Diagnosis not present

## 2021-04-02 DIAGNOSIS — Z1151 Encounter for screening for human papillomavirus (HPV): Secondary | ICD-10-CM | POA: Insufficient documentation

## 2021-04-02 DIAGNOSIS — N760 Acute vaginitis: Secondary | ICD-10-CM

## 2021-04-02 DIAGNOSIS — Z124 Encounter for screening for malignant neoplasm of cervix: Secondary | ICD-10-CM | POA: Insufficient documentation

## 2021-04-02 DIAGNOSIS — Z3046 Encounter for surveillance of implantable subdermal contraceptive: Secondary | ICD-10-CM | POA: Diagnosis not present

## 2021-04-02 DIAGNOSIS — R69 Illness, unspecified: Secondary | ICD-10-CM | POA: Diagnosis not present

## 2021-04-02 DIAGNOSIS — Z01419 Encounter for gynecological examination (general) (routine) without abnormal findings: Secondary | ICD-10-CM | POA: Diagnosis not present

## 2021-04-02 DIAGNOSIS — Z113 Encounter for screening for infections with a predominantly sexual mode of transmission: Secondary | ICD-10-CM

## 2021-04-02 MED ORDER — METRONIDAZOLE 500 MG PO TABS
500.0000 mg | ORAL_TABLET | Freq: Two times a day (BID) | ORAL | 0 refills | Status: AC
Start: 2021-04-02 — End: 2021-04-09

## 2021-04-02 NOTE — Progress Notes (Signed)
Gynecology Annual Exam   PCP: Patient, No Pcp Per (Inactive)  Chief Complaint: No chief complaint on file.   History of Present Illness: Patient is a 30 y.o. [id number] presents for annual exam. The patient has no complaints today.   LMP: No LMP recorded. (Menstrual status: Oral contraceptives). Amenorrhea on nexplanon   The patient is not currently sexually active. She currently uses Nexplanon for contraception. She denies dyspareunia.   There is no notable family history of breast or ovarian cancer in her family.  The patient wears seatbelts: yes.   The patient has regular exercise: not asked.    The patient denies current symptoms of depression.    Has noted some vaginal discharge and odor.  Has tried douching without improvement.  No antibiotics exposures.    Review of Systems: Review of Systems  Constitutional:  Negative for chills and fever.  HENT:  Negative for congestion.   Respiratory:  Negative for cough and shortness of breath.   Cardiovascular:  Negative for chest pain and palpitations.  Gastrointestinal:  Negative for abdominal pain, constipation, diarrhea, heartburn, nausea and vomiting.  Genitourinary:  Negative for dysuria, frequency and urgency.  Skin:  Negative for itching and rash.  Neurological:  Negative for dizziness and headaches.  Endo/Heme/Allergies:  Negative for polydipsia.  Psychiatric/Behavioral:  Negative for depression.    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 He states he has already been tested and is hgb AA.     Supervision of low-risk pregnancy 11/08/2017    [facility]  Dating  LMP=6 week 01/08/2018 Blood type: AB/Positive/-- (04/10 1518)   Genetic Screen Declined trisomy testing Inheritest negative Antibody:Negative (04/10 1518)  Anatomic [birth year]1999 Female, incomplete [X]  complete 24 weeks Rubella: 13.70 (04/10 1518) Varicella: Immune  GTT 99 RPR: Non Reactive (04/10 1518)   Rhogam N/A HBsAg: Negative (04/10 1518)   TDaP vaccine                       Flu Shot:05/02/18 HIV: Non Reactive (04/10 1518)   Baby Food                                GBS: Positive  Contraception  Pap: 11/08/17 NIL  CBB     CS/VBAC    Support Person          Miscarriage within last 12 months 10/30/2017    Past Surgical History:  Past Surgical History:  Procedure Laterality Date   DILATION AND CURETTAGE OF UTERUS N/A 06/29/2017   Procedure: DILATATION AND CURETTAGE;  Surgeon: 12/30/2017, MD;  Location: [facility] ORS;  Service: Gynecology;  Laterality: N/A;   NO PAST SURGERIES      Gynecologic History:  No LMP recorded. (Menstrual status: Oral contraceptives). Contraception:08/22/2018 Nexplanon Last Pap: Results were: 4/10/2019NIL and HR HPV negative   Obstetric History: 08/24/2018  Family History:  Family History  Problem Relation Age of Onset   Hypertension Mother     Social History:  Social History   Socioeconomic History   Marital status: Single    Spouse  name: Not on file   Number of children: Not on file   Years of education: Not on file   Highest education level: Not on file  Occupational History   Not on file  Tobacco Use   Smoking status: Never   Smokeless tobacco: Never  Vaping Use   Vaping Use: Never used  Substance and Sexual Activity   Alcohol use: No   Drug use: No   Sexual activity: Not Currently    Birth control/protection: None  Other Topics Concern   Not on file  Social History Narrative   Not on file   Social Determinants of Health   Financial Resource Strain: Not on file  Food Insecurity: Not on file  Transportation Needs: Not on file  Physical  Activity: Not on file  Stress: Not on file  Social Connections: Not on file  Intimate Partner Violence: Not on file    Allergies:  No Known Allergies  Medications: Prior to Admission medications   Medication Sig Start Date End Date Taking? Authorizing Provider  Ascorbic Acid (VITAMIN C PO) Take 1 tablet by mouth daily.    [provider]  ibuprofen (ADVIL,MOTRIN) 600 MG tablet Take 1 tablet (600 mg total) by mouth every 6 (six) hours. Patient not taking: Reported on 07/27/2018 06/14/18   Donette Larry, CNM  ondansetron (ZOFRAN ODT) 4 MG disintegrating tablet Take 1 tablet (4 mg total) by mouth every 8 (eight) hours as needed for nausea or vomiting. 12/17/19   Audelia Acton, Merla Riches, PA-C  Prenat-FeCbn-FeAsp-Meth-FA-DHA (PRENATE MINI) 18-0.6-0.4-350 MG CAPS Take 1 tablet by mouth daily. Patient not taking: Reported on 12/17/2019 11/08/17   Conard Novak, MD  Thiamine HCl (VITAMIN B-1 PO) Take 1 tablet by mouth daily.    [provider]  VITAMIN D PO Take 1 tablet by mouth daily.    [provider]  VITAMIN E PO Take 1 tablet by mouth daily.    [provider]    Physical Exam Vitals: Blood pressure 118/68, pulse 74, height 5\' 5"  (1.651 m), weight 140 lb (63.5 kg), unknown if currently breastfeeding.  General: NAD HEENT: normocephalic, anicteric Thyroid: no enlargement, no palpable nodules Pulmonary: No increased work of breathing, CTAB Cardiovascular: RRR, distal pulses 2+ Breast: Breast symmetrical, no tenderness, no palpable nodules or masses, no skin or nipple retraction present, no nipple discharge.  No axillary or supraclavicular lymphadenopathy. Abdomen: NABS, soft, non-tender, non-distended.  Umbilicus without lesions.  No hepatomegaly, splenomegaly or masses palpable. No evidence of hernia  Genitourinary:  External: Normal external female genitalia.  Normal urethral meatus, normal Bartholin's and Skene's glands.    Vagina: Normal vaginal  mucosa, no evidence of prolapse.    Cervix: Grossly normal in appearance, no bleeding  Uterus: Non-enlarged, mobile, normal contour.  No CMT  Adnexa: ovaries non-enlarged, no adnexal masses  Rectal: deferred  Lymphatic: no evidence of inguinal lymphadenopathy Extremities: no edema, erythema, or tenderness Neurologic: Grossly intact Psychiatric: mood appropriate, affect full  Female chaperone present for pelvic and breast  portions of the physical exam    Assessment: 30 y.o. 36 routine annual exam  Plan: Problem List Items Addressed This Visit   None Visit Diagnoses     Encounter for gynecological examination without abnormal finding    -  Primary   Screening for malignant neoplasm of cervix       Relevant Orders   Cytology - PAP   Breast screening       Surveillance of implantable subdermal contraceptive  Bacterial vaginosis       Relevant Medications   metroNIDAZOLE (FLAGYL) 500 MG tablet   Other Relevant Orders   NuSwab Vaginitis Plus (VG+)   Routine screening for STI (sexually transmitted infection)       Relevant Orders   NuSwab Vaginitis Plus (VG+)       1) STI screening  wasoffered and accepted - Nuswab sent Rx flagyl for BV  2)  ASCCP guidelines and rational discussed.  Patient opts for every 3 years screening interval  3) Contraception - the patient is currently using  Nexplanon.  She is happy with her current form of contraception and plans to continue - removal and re-insertion in January  4) Routine healthcare maintenance including cholesterol, diabetes screening discussed managed by PCP  5) Return in about 4 months (around 08/02/2021) for nexplanon removal and reinsertion.   Vena Austria, MD, [facility], [facility] 04/02/2021, 1:31 PM

## 2021-04-06 LAB — NUSWAB VAGINITIS PLUS (VG+)
Atopobium vaginae: HIGH Score — AB
BVAB 2: HIGH Score — AB
Candida albicans, NAA: NEGATIVE
Candida glabrata, NAA: NEGATIVE
Chlamydia trachomatis, NAA: NEGATIVE
Megasphaera 1: HIGH Score — AB
Neisseria gonorrhoeae, NAA: NEGATIVE
Trich vag by NAA: NEGATIVE

## 2021-04-13 LAB — CYTOLOGY - PAP
Adequacy: ABSENT
Comment: NEGATIVE
Diagnosis: UNDETERMINED — AB
High risk HPV: POSITIVE — AB

## 2021-05-04 DIAGNOSIS — R1084 Generalized abdominal pain: Secondary | ICD-10-CM | POA: Diagnosis not present

## 2021-05-04 DIAGNOSIS — R609 Edema, unspecified: Secondary | ICD-10-CM | POA: Diagnosis not present

## 2021-05-04 DIAGNOSIS — M79661 Pain in right lower leg: Secondary | ICD-10-CM | POA: Diagnosis not present

## 2021-05-04 DIAGNOSIS — S76011A Strain of muscle, fascia and tendon of right hip, initial encounter: Secondary | ICD-10-CM | POA: Diagnosis not present

## 2021-05-04 DIAGNOSIS — M79651 Pain in right thigh: Secondary | ICD-10-CM | POA: Diagnosis not present

## 2021-05-04 DIAGNOSIS — R1030 Lower abdominal pain, unspecified: Secondary | ICD-10-CM | POA: Diagnosis not present

## 2021-05-04 DIAGNOSIS — M25561 Pain in right knee: Secondary | ICD-10-CM | POA: Diagnosis not present

## 2021-05-04 DIAGNOSIS — R079 Chest pain, unspecified: Secondary | ICD-10-CM | POA: Diagnosis not present

## 2021-07-06 ENCOUNTER — Telehealth: Payer: Self-pay | Admitting: Obstetrics & Gynecology

## 2021-07-06 NOTE — Telephone Encounter (Signed)
Noted. Will order to arrive by apt date/time. 

## 2021-07-06 NOTE — Telephone Encounter (Signed)
Pt scheduled for Nexplanon removal and reinsertion with RPH on 12/30 at 3:50.

## 2021-07-12 ENCOUNTER — Ambulatory Visit: Payer: 59 | Admitting: Nurse Practitioner

## 2021-07-15 ENCOUNTER — Ambulatory Visit: Payer: 59 | Admitting: Nurse Practitioner

## 2021-07-30 ENCOUNTER — Ambulatory Visit: Payer: 59 | Admitting: Obstetrics & Gynecology

## 2021-08-09 NOTE — Telephone Encounter (Signed)
No Show for 07/30/21 apt. Nexplanon placed back in stock. Will need to reserve again if patient reschedules.

## 2021-08-23 ENCOUNTER — Telehealth: Payer: Self-pay

## 2021-08-23 NOTE — Telephone Encounter (Signed)
Pt calling; was unable to keep her appt to switch out her nexplanon; [place]; the nurses there are telling her it is now good for 70yrs instead of three; pt calling to confirm.  [phone]

## 2021-08-23 NOTE — Telephone Encounter (Signed)
There are studies that look at 4 and 5 year data, but the Manufacturer and Guidelines are currently set at 3 years

## 2021-08-23 NOTE — Telephone Encounter (Signed)
Left detailed msg of PH's response.
